# Patient Record
Sex: Male | Born: 1995 | Race: White | Hispanic: No | Marital: Single | Smoking: Never smoker
Health system: Southern US, Community
[De-identification: ages and names within clinical notes are randomized; demographics above are authoritative.]

## PROBLEM LIST (undated history)

## (undated) DIAGNOSIS — T7840XA Allergy, unspecified, initial encounter: Secondary | ICD-10-CM

## (undated) DIAGNOSIS — J45909 Unspecified asthma, uncomplicated: Secondary | ICD-10-CM

## (undated) DIAGNOSIS — Z8659 Personal history of other mental and behavioral disorders: Secondary | ICD-10-CM

## (undated) HISTORY — DX: Personal history of other mental and behavioral disorders: Z86.59

## (undated) HISTORY — DX: Allergy, unspecified, initial encounter: T78.40XA

---
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
## 2017-09-19 ENCOUNTER — Encounter: Payer: Self-pay | Admitting: Emergency Medicine

## 2017-09-19 ENCOUNTER — Emergency Department
Admission: EM | Admit: 2017-09-19 | Discharge: 2017-09-19 | Disposition: A | Discharge status: Home / Self Care | Payer: No Typology Code available for payment source | Attending: Emergency Medicine | Admitting: Emergency Medicine

## 2017-09-19 ENCOUNTER — Emergency Department: Payer: No Typology Code available for payment source

## 2017-09-19 DIAGNOSIS — J45909 Unspecified asthma, uncomplicated: Secondary | ICD-10-CM | POA: Insufficient documentation

## 2017-09-19 DIAGNOSIS — N5089 Other specified disorders of the male genital organs: Secondary | ICD-10-CM

## 2017-09-19 DIAGNOSIS — N453 Epididymo-orchitis: Secondary | ICD-10-CM | POA: Insufficient documentation

## 2017-09-19 DIAGNOSIS — N50812 Left testicular pain: Secondary | ICD-10-CM | POA: Diagnosis present

## 2017-09-19 LAB — CHLAMYDIA/NGC RT PCR (ARMC ONLY)
Chlamydia Tr: NOT DETECTED
N GONORRHOEAE: NOT DETECTED

## 2017-09-19 MED ORDER — CEFTRIAXONE SODIUM 250 MG IJ SOLR
250.0000 mg | Freq: Once | INTRAMUSCULAR | Status: DC
  Filled 2017-09-19: qty 250

## 2017-09-19 MED ORDER — DOXYCYCLINE HYCLATE 100 MG PO TABS
100.0000 mg | ORAL_TABLET | Freq: Once | ORAL | Status: AC
  Administered 2017-09-19: 100 mg via ORAL
  Filled 2017-09-19: qty 1

## 2017-09-19 MED ORDER — IBUPROFEN 400 MG PO TABS
600.0000 mg | ORAL_TABLET | Freq: Once | ORAL | Status: AC
  Administered 2017-09-19: 600 mg via ORAL
  Filled 2017-09-19: qty 2

## 2017-09-19 NOTE — ED Triage Notes (Signed)
Pt [facility] due to testicle swelling and pain in the left testicle x1 day. Pt reports 1 week ago white discharge and painful urination.

## 2017-09-19 NOTE — ED Provider Notes (Signed)
[facility] Emergency Department Provider Note  ____________________________________________  Time seen: Approximately 8:13 PM  I have reviewed the triage vital signs and the nursing notes.   HISTORY  Chief Complaint Groin Swelling   HPI Bernard Price is a 22 y.o. male with no significant past medical history who presents for evaluation of left testicular pain.  Patient reports that the pain started today.  He is noted swelling of his testicles for the last few days.  The pain is sharp, located in his left testicle, radiating up to his abdomen, currently 6 out of 10.  No nausea, vomiting, fever or chills.  Patient reports that a week ago he noticed white discharge from his penis and has had a week of dysuria.  No prior history of STD.  Patient is sexually active with one male partner and does not use condoms.  Past Medical History:  Diagnosis Date  . Asthma    as a child   Prior to Admission medications   Not on File    Allergies Patient has no known allergies.  History reviewed. No pertinent family history.  Social History Social History   Tobacco Use  . Smoking status: Never Smoker  . Smokeless tobacco: Never Used  Substance Use Topics  . Alcohol use: No    Alcohol/week: 0.0 oz  . Drug use: Not on file    Review of Systems  Constitutional: Negative for fever. Eyes: Negative for visual changes. ENT: Negative for sore throat. Neck: No neck pain  Cardiovascular: Negative for chest pain. Respiratory: Negative for shortness of breath. Gastrointestinal: Negative for abdominal pain, vomiting or diarrhea. Genitourinary: + dysuria and testicular pain/ swelling Musculoskeletal: Negative for back pain. Skin: Negative for rash. Neurological: Negative for headaches, weakness or numbness. Psych: No SI or HI  ____________________________________________   PHYSICAL EXAM:  VITAL SIGNS: ED Triage Vitals  Enc Vitals Group     BP 09/19/17  1912 140/65     Pulse Rate 09/19/17 1912 91     Resp 09/19/17 1912 17     Temp 09/19/17 1912 98.8 F (37.1 C)     Temp Source 09/19/17 1912 Oral     SpO2 09/19/17 1912 98 %     Weight 09/19/17 1913 200 lb (90.7 kg)     Height --      Head Circumference --      Peak Flow --      Pain Score 09/19/17 1929 7     Pain Loc --      Pain Edu? --      Excl. in GC? --     Constitutional: Alert and oriented. Well appearing and in no apparent distress. HEENT:      Head: Normocephalic and atraumatic.         Eyes: Conjunctivae are normal. Sclera is non-icteric.       Mouth/Throat: Mucous membranes are moist.       Neck: Supple with no signs of meningismus. Cardiovascular: Regular rate and rhythm. No murmurs, gallops, or rubs. 2+ symmetrical distal pulses are present in all extremities. No JVD. Respiratory: Normal respiratory effort. Lungs are clear to auscultation bilaterally. No wheezes, crackles, or rhonchi.  Gastrointestinal: Soft, non tender, and non distended with positive bowel sounds. No rebound or guarding. Genitourinary: No CVA tenderness. Bilateral testicles are descended, left testicle is tender to palpation over the epididymis, no tenderness to palpation on the right testicle, bilateral positive cremasteric reflexes are present, no swelling or erythema of  the scrotum. No evidence of inguinal hernia. Musculoskeletal: Nontender with normal range of motion in all extremities. No edema, cyanosis, or erythema of extremities. Neurologic: Normal speech and language. Face is symmetric. Moving all extremities. No gross focal neurologic deficits are appreciated. Skin: Skin is warm, dry and intact. No rash noted. Psychiatric: Mood and affect are normal. Speech and behavior are normal.  ____________________________________________   LABS (all labs ordered are listed, but only abnormal results are displayed)  Labs Reviewed  CHLAMYDIA/NGC RT PCR [facility] ONLY)    ____________________________________________  EKG  none  ____________________________________________  RADIOLOGY  I have personally reviewed the images performed during this visit and I agree with the Radiologist's read.   Interpretation by Radiologist:  [place] Scrotum W/doppler  Result Date: 09/19/2017 CLINICAL DATA:  22 year old male with left testicle pain and bilateral testicular swelling for 1 day. Burning with urination for 2 days. EXAM: SCROTAL ULTRASOUND DOPPLER ULTRASOUND OF THE TESTICLES TECHNIQUE: Complete ultrasound examination of the testicles, epididymis, and other scrotal structures was performed. Color and spectral Doppler ultrasound were also utilized to evaluate blood flow to the testicles. COMPARISON:  None. FINDINGS: Right testicle Measurements: 5.0 x 2.8 x 3.3 centimeters. No mass or microlithiasis visualized. Left testicle Measurements: 4.6 x 2.4 x 3.1 centimeters. No mass or microlithiasis visualized. Right epididymis: Normal in size and appearance. No hypervascularity (image 49). Left epididymis: Enlarged and heterogeneous left epididymis tail (images 58 through 61) with hypervascularity. Hydrocele:  None visualized. Varicocele:  None visualized. Pulsed Doppler interrogation of both testes demonstrates low resistance arterial and venous waveforms bilaterally, with questionable bilateral testicular hypervascularity. IMPRESSION: 1. Enlarged and hypervascular left epididymis compatible with Acute Epididymitis. 2. Questionable associated mild acute bilateral orchitis, as evidenced by possible bilateral testicular hypervascularity. 3. Negative for testicular torsion, testicular mass, or hydrocele. Electronically Signed   By: Odessa FlemingH  Hall M.D.   On: 09/19/2017 20:39      ____________________________________________   PROCEDURES  Procedure(s) performed: None Procedures Critical Care performed:  None ____________________________________________   INITIAL IMPRESSION /  ASSESSMENT AND PLAN / ED COURSE  22 y.o. male with no significant past medical history who presents for evaluation of left testicular pain x 1 day in the setting of white discharge from his penis and dysuria for a week.  On exam there is no evidence of torsion with positive cremasteric reflex and normal lie of bilateral testicles.  Patient is tender to palpation over the left epididymis.  Differential diagnosis includes epididymitis versus orchitis versus varicocele versus hydrocele versus testicular torsion versus testicular mass.  Will check gonorrhea chlamydia in the urine and send patient for an ultrasound.    _________________________ 9:57 PM on 09/19/2017 -----------------------------------------  Ultrasound consistent with epididymoorchitis.  Patient had already received Rocephin at urgent care and given a prescription for doxycycline.  We will give the first dose of doxycycline here since pharmacy is already closed.  Discussed signs and symptoms of torsion and recommended emergent evaluation if these develop.  Discussed safe sex counseling with patient and recommended follow-up with his primary care doctor to be tested for other sexually transmitted diseases such as syphilis or HIV.  Discussed the treatment of sexual partners to prevent reinfection.   As part of my medical decision making, I reviewed the following data within the electronic MEDICAL RECORD NUMBER Nursing notes reviewed and incorporated, Labs reviewed , Radiograph reviewed , Notes from prior ED visits and Echo Controlled Substance Database    Pertinent labs & imaging results that were available during my care  of the patient were reviewed by me and considered in my medical decision making (see chart for details).    ____________________________________________   FINAL CLINICAL IMPRESSION(S) / ED DIAGNOSES  Final diagnoses:  Epididymoorchitis      NEW MEDICATIONS STARTED DURING THIS VISIT:  ED Discharge Orders    None        Note:  This document was prepared using Dragon voice recognition software and may include unintentional dictation errors.    Nita Sickle, MD 09/19/17 2159

## 2017-09-19 NOTE — Discharge Instructions (Addendum)
Take the antibiotics given to you at urgent care.  Follow-up with your doctor in 3 days.  As I explained to you it is very important to use condoms to protect you from catching sexually transmitted diseases.  There are other diseases such as HIV and syphilis which can be caught by unprotected sex.  We have not tested you for those here.  Please discuss it with your primary care doctor so you can be tested as well.  Return to the emergency room if you have new or severe testicular pain, swelling or redness.

## 2018-02-24 ENCOUNTER — Encounter: Payer: Self-pay | Admitting: Family Medicine

## 2018-02-24 ENCOUNTER — Other Ambulatory Visit: Payer: Self-pay

## 2018-02-24 ENCOUNTER — Ambulatory Visit (INDEPENDENT_AMBULATORY_CARE_PROVIDER_SITE_OTHER): Payer: PRIVATE HEALTH INSURANCE | Admitting: Family Medicine

## 2018-02-24 VITALS — BP 107/69 | HR 89 | Temp 98.2°F | Ht 70.2 in | Wt 206.0 lb

## 2018-02-24 DIAGNOSIS — Z0000 Encounter for general adult medical examination without abnormal findings: Secondary | ICD-10-CM

## 2018-02-24 DIAGNOSIS — Z23 Encounter for immunization: Secondary | ICD-10-CM

## 2018-02-24 LAB — UA/M W/RFLX CULTURE, ROUTINE
BILIRUBIN UA: NEGATIVE
GLUCOSE, UA: NEGATIVE
KETONES UA: NEGATIVE
LEUKOCYTES UA: NEGATIVE
Nitrite, UA: NEGATIVE
Protein, UA: NEGATIVE
RBC UA: NEGATIVE
SPEC GRAV UA: 1.01 (ref 1.005–1.030)
Urobilinogen, Ur: 0.2 mg/dL (ref 0.2–1.0)
pH, UA: 7 (ref 5.0–7.5)

## 2018-02-24 LAB — LIPID PANEL PICCOLO, WAIVED
Chol/HDL Ratio Piccolo,Waive: 2.9 mg/dL
Cholesterol Piccolo, Waived: 135 mg/dL (ref <200)
HDL Chol Piccolo, Waived: 47 mg/dL — ABNORMAL LOW (ref >59)
LDL Chol Calc Piccolo Waived: 63 mg/dL (ref <100)
Triglycerides Piccolo,Waived: 127 mg/dL (ref <150)
VLDL CHOL CALC PICCOLO,WAIVE: 25 mg/dL (ref <30)

## 2018-02-24 NOTE — Patient Instructions (Signed)
Preventive Care for [place], Male The transition to life after high school as a young adult can be a stressful time with many changes. You may start seeing a primary care physician instead of a pediatrician. This is the time when your health care becomes your responsibility. Preventive care refers to lifestyle choices and visits with your health care provider that can promote health and wellness. What does preventive care include?  A yearly physical exam. This is also called an annual wellness visit.  Dental exams once or twice a year.  Routine eye exams. Ask your health care provider how often you should have your eyes checked.  Personal lifestyle choices, including: ? Daily care of your teeth and gums. ? Regular physical activity. ? Eating a healthy diet. ? Avoiding tobacco and drug use. ? Avoiding or limiting alcohol use. ? Practicing safe sex. What happens during an annual wellness visit? Preventive care starts with a yearly visit to your primary care physician. The services and screenings done by your health care provider during your annual wellness visit will depend on your overall health, lifestyle risk factors, and family history of disease. Counseling Your health care provider may ask you questions about:  Past medical problems and your family's medical history.  Medicines or supplements that you take.  Health insurance and access to health care.  Alcohol, tobacco, and drug use, including use of any bodybuilding drugs (anabolic steroids).  Your safety at home, work, or school.  Access to firearms.  Emotional well-being and how you cope with stress.  Relationship well-being.  Diet, exercise, and sleep habits.  Your sexual health and activity.  Screening You may have the following tests or measurements:  Height, weight, and BMI.  Blood pressure.  Lipid and cholesterol levels.  Tuberculosis skin test.  Skin exam.  Vision and hearing tests.  Genital  exam to check for testicular cancer or hernias.  Screening test for hepatitis.  Screening tests for STDs (sexually transmitted diseases), if you are at risk.  Vaccines Your health care provider may recommend certain vaccines, such as:  Influenza vaccine. This is recommended every year.  Tetanus, diphtheria, and acellular pertussis (Tdap, Td) vaccine. You may need a Td booster every 10 years.  Varicella vaccine. You may need this if you have not been vaccinated.  HPV vaccine. If you are 8 or younger, you may need three doses over 6 months.  Measles, mumps, and rubella (MMR) vaccine. You may need at least one dose of MMR. You may also need a second dose.  Pneumococcal 13-valent conjugate (PCV13) vaccine. You may need this if you have certain conditions and have not been vaccinated.  Pneumococcal polysaccharide (PPSV23) vaccine. You may need one or two doses if you smoke cigarettes or if you have certain conditions.  Meningococcal vaccine. One dose is recommended if you are age 83-21 years and a first-year college student living in a residence hall, or if you have one of several medical conditions. You may also need additional booster doses.  Hepatitis A vaccine. You may need this if you have certain conditions or if you travel or work in places where you may be exposed to hepatitis A.  Hepatitis B vaccine. You may need this if you have certain conditions or if you travel or work in places where you may be exposed to hepatitis B.  Haemophilus influenzae type b (Hib) vaccine. You may need this if you have certain risk factors.  Talk to your health care provider about which  screenings and vaccines you need and how often you need them. What steps can I take to develop healthy behaviors?  Have regular preventive health care visits with your primary care physician and dentist.  Eat a healthy diet.  Drink enough fluid to keep your urine clear or pale yellow.  Stay active. Exercise at  least 30 minutes 5 or more days of the week.  Use alcohol responsibly.  Maintain a healthy weight.  Do not use any products that contain nicotine, such as cigarettes, chewing tobacco, and e-cigarettes. If you need help quitting, ask your health care provider.  Do not use drugs.  Practice safe sex. This includes using condoms to prevent STDs or an unwanted pregnancy.  Find healthy ways to manage stress. How can I protect myself from injury? Injuries from violence or accidents are the leading cause of death among young adults and can often be prevented. Take these steps to help protect yourself:  Always wear your seat belt while driving or riding in a vehicle.  Do not drive if you have been drinking alcohol. Do not ride with someone who has been drinking.  Do not drive when you are tired or distracted. Do not text while driving.  Wear a helmet and other protective equipment during sports activities.  If you have firearms in your house, make sure you follow all gun safety procedures.  Seek help if you have been bullied, physically abused, or sexually abused.  Avoid fighting.  Use the Internet responsibly to avoid dangers such as online bullying.  What can I do to cope with stress? Young adults may face many new challenges that can be stressful, such as finding a job, going to college, moving away from home, managing money, being in a relationship, getting married, and having children. To manage stress:  Avoid known stressful situations when you can.  Exercise regularly.  Find a stress-reducing activity that works best for you. Examples include meditation, yoga, listening to music, or reading.  Spend time in nature.  Keep a journal to write about your stress and how you respond.  Talk to your health care provider about stress. He or she may suggest counseling.  Spend time with supportive friends or family.  Do not cope with stress by: ? Drinking alcohol or using  drugs. ? Smoking cigarettes. ? Eating.  Where can I get more information? Learn more about preventive care and healthy habits from:  U.S. Preventive Services Task Force: StageSync.si  National Adolescent and [place]: StrategicRoad.nl  American Academy of Pediatrics Bright Futures: https://brightfutures.MemberVerification.co.za  Society for Adolescent Health and Medicine: MoralBlog.co.za.aspx  PodExchange.nl: ToyLending.fr  This information is not intended to replace advice given to you by your health care provider. Make sure you discuss any questions you have with your health care provider. Document Released: 07/24/2015 Document Revised: 08/14/2015 Document Reviewed: 07/24/2015 Elsevier Interactive Patient Education  2018 [place]. Hepatitis A Vaccine: What You Need to Know 1. Why get vaccinated? Hepatitis A is a serious liver disease. It is caused by the hepatitis A virus (HAV). HAV is spread from person to person through contact with the feces (stool) of people who are infected, which can easily happen if someone does not wash his or her hands properly. You can also get hepatitis A from food, water, or objects contaminated with HAV. Symptoms of hepatitis A can include:  fever, fatigue, loss of appetite, nausea, vomiting, and/or joint pain  severe stomach pains and diarrhea (mainly in children), or  jaundice (yellow skin or eyes, dark urine, clay-colored bowel movements).  These symptoms usually appear 2 to 6 weeks after exposure and usually last less than 2 months, although some people can be ill for as long as 6 months. If you have hepatitis A you may be too ill to work. Children often do not have symptoms, but most adults do. You can spread HAV  without having symptoms. Hepatitis A can cause liver failure and death, although this is rare and occurs more commonly in persons 53 years of age or older and persons with other liver diseases, such as hepatitis B or C. Hepatitis A vaccine can prevent hepatitis A. Hepatitis A vaccines were recommended in the [place] beginning in 1996. Since then, the number of cases reported each year in the [place] has dropped from around 31,000 cases to fewer than 1,500 cases. 2. Hepatitis A vaccine Hepatitis A vaccine is an inactivated (killed) vaccine. You will need 2 doses for long-lasting protection. These doses should be given at least 6 months apart. Children are routinely vaccinated between their first and second birthdays (53 through 23 months of age). Older children and adolescents can get the vaccine after 23 months. Adults who have not been vaccinated previously and want to be protected against hepatitis A can also get the vaccine. You should get hepatitis A vaccine if you:  are traveling to countries where hepatitis A is common,  are a man who has sex with other men,  use illegal drugs,  have a chronic liver disease such as hepatitis B or hepatitis C,  are being treated with clotting-factor concentrates,  work with hepatitis A-infected animals or in a hepatitis A research laboratory, or  expect to have close personal contact with an international adoptee from a country where hepatitis A is common  Ask your healthcare provider if you want more information about any of these groups. There are no known risks to getting hepatitis A vaccine at the same time as other vaccines. 3. Some people should not get this vaccine Tell the person who is giving you the vaccine:  If you have any severe, life-threatening allergies. If you ever had a life-threatening allergic reaction after a dose of hepatitis A vaccine, or have a severe allergy to any part of this vaccine, you may be advised not to get  vaccinated. Ask your health care provider if you want information about vaccine components.  If you are not feeling well. If you have a mild illness, such as a cold, you can probably get the vaccine today. If you are moderately or severely ill, you should probably wait until you recover. Your doctor can advise you.  4. Risks of a vaccine reaction With any medicine, including vaccines, there is a chance of side effects. These are usually mild and go away on their own, but serious reactions are also possible. Most people who get hepatitis A vaccine do not have any problems with it. Minor problems following hepatitis A vaccine include:  soreness or redness where the shot was given  low-grade fever  headache  tiredness  If these problems occur, they usually begin soon after the shot and last 1 or 2 days. Your doctor can tell you more about these reactions. Other problems that could happen after this vaccine:  People sometimes faint after a medical procedure, including vaccination. Sitting or lying down for about 15 minutes can help prevent fainting, and injuries caused by a fall. Tell your provider if you feel dizzy, or  have vision changes or ringing in the ears.  Some people get shoulder pain that can be more severe and longer lasting than the more routine soreness that can follow injections. This happens very rarely.  Any medication can cause a severe allergic reaction. Such reactions from a vaccine are very rare, estimated at about 1 in a million doses, and would happen within a few minutes to a few hours after the vaccination. As with any medicine, there is a very remote chance of a vaccine causing a serious injury or death. The safety of vaccines is always being monitored. For more information, visit: http://www.aguilar.org/ 5. What if there is a serious problem? What should I look for? Look for anything that concerns you, such as signs of a severe allergic reaction, very high  fever, or unusual behavior. Signs of a severe allergic reaction can include hives, swelling of the face and throat, difficulty breathing, a fast heartbeat, dizziness, and weakness. These would start a few minutes to a few hours after the vaccination. What should I do?  If you think it is a severe allergic reaction or other emergency that can't wait, call 9-1-1 or get to the nearest hospital. Otherwise, call your clinic.  Afterward, the reaction should be reported to the Vaccine Adverse Event Reporting System (VAERS). Your doctor should file this report, or you can do it yourself through the VAERS web site at www.vaers.SamedayNews.es, or by calling 360-201-2422. ? VAERS does not give medical advice. 6. The National Vaccine Injury Compensation Program The [employer] Vaccine Injury Compensation Program (VICP) is a federal program that was created to compensate people who may have been injured by certain vaccines. Persons who believe they may have been injured by a vaccine can learn about the program and about filing a claim by calling (579)187-3018 or visiting the [place] website at GoldCloset.com.ee. There is a time limit to file a claim for compensation. 7. How can I learn more?  Ask your healthcare provider. He or she can give you the vaccine package insert or suggest other sources of information.  Call your local or state health department.  Contact the Centers for Disease Control and Prevention (CDC): ? Call 405 216 2202 (1-800-CDC-INFO) or ? Visit CDC's website at http://hunter.com/ CDC Hepatitis A Vaccine VIS (10/09/2014) This information is not intended to replace advice given to you by your health care provider. Make sure you discuss any questions you have with your health care provider. Document Released: 12/31/2005 Document Revised: 11/27/2015 Document Reviewed: 11/27/2015 Elsevier Interactive Patient Education  2017 [employer] HPV (Human Papillomavirus) Vaccine: What You  Need to Know 1. Why get vaccinated? HPV vaccine prevents infection with human papillomavirus (HPV) types that are associated with many cancers, including:  cervical cancer in females,  vaginal and vulvar cancers in females,  anal cancer in females and males,  throat cancer in females and males, and  penile cancer in males.  In addition, HPV vaccine prevents infection with HPV types that cause genital warts in both females and males. In the [place] about 12,000 women get cervical cancer every year, and about 4,000 women die from it. HPV vaccine can prevent most of these cases of cervical cancer. Vaccination is not a substitute for cervical cancer screening. This vaccine does not protect against all HPV types that can cause cervical cancer. Women should still get regular Pap tests. HPV infection usually comes from sexual contact, and most people will become infected at some point in their life. About 14 million Americans, including teens,  get infected every year. Most infections will go away on their own and not cause serious problems. But thousands of women and men get cancer and other diseases from HPV. 2. HPV vaccine HPV vaccine is approved by FDA and is recommended by CDC for both males and females. It is routinely given at 2 or 20 years of age, but it may be given beginning at age 35 years through age 45 years. Most adolescents 9 through 20 years of age should get HPV vaccine as a two-dose series with the doses separated by 6-12 months. People who start HPV vaccination at 97 years of age and older should get the vaccine as a three-dose series with the second dose given 1-2 months after the first dose and the third dose given 6 months after the first dose. There are several exceptions to these age recommendations. Your health care provider can give you more information. 3. Some people should not get this vaccine  Anyone who has had a severe (life-threatening) allergic reaction to a dose of HPV  vaccine should not get another dose.  Anyone who has a severe (life threatening) allergy to any component of HPV vaccine should not get the vaccine.  Tell your doctor if you have any severe allergies that you know of, including a severe allergy to yeast.  HPV vaccine is not recommended for pregnant women. If you learn that you were pregnant when you were vaccinated, there is no reason to expect any problems for you or your baby. Any woman who learns she was pregnant when she got HPV vaccine is encouraged to contact the manufacturer's registry for HPV vaccination during pregnancy at (657) 155-3207. Women who are breastfeeding may be vaccinated.  If you have a mild illness, such as a cold, you can probably get the vaccine today. If you are moderately or severely ill, you should probably wait until you recover. Your doctor can advise you. 4. Risks of a vaccine reaction With any medicine, including vaccines, there is a chance of side effects. These are usually mild and go away on their own, but serious reactions are also possible. Most people who get HPV vaccine do not have any serious problems with it. Mild or moderate problems following HPV vaccine:  Reactions in the arm where the shot was given: ? Soreness (about 9 people in 10) ? Redness or swelling (about 1 person in 3)  Fever: ? Mild (100F) (about 1 person in 10) ? Moderate (102F) (about 1 person in 49)  Other problems: ? Headache (about 1 person in 3) Problems that could happen after any injected vaccine:  People sometimes faint after a medical procedure, including vaccination. Sitting or lying down for about 15 minutes can help prevent fainting, and injuries caused by a fall. Tell your doctor if you feel dizzy, or have vision changes or ringing in the ears.  Some people get severe pain in the shoulder and have difficulty moving the arm where a shot was given. This happens very rarely.  Any medication can cause a severe allergic  reaction. Such reactions from a vaccine are very rare, estimated at about 1 in a million doses, and would happen within a few minutes to a few hours after the vaccination. As with any medicine, there is a very remote chance of a vaccine causing a serious injury or death. The safety of vaccines is always being monitored. For more information, visit: http://www.aguilar.org/. 5. What if there is a serious reaction? What should I look for? Look  for anything that concerns you, such as signs of a severe allergic reaction, very high fever, or unusual behavior. Signs of a severe allergic reaction can include hives, swelling of the face and throat, difficulty breathing, a fast heartbeat, dizziness, and weakness. These would usually start a few minutes to a few hours after the vaccination. What should I do? If you think it is a severe allergic reaction or other emergency that can't wait, call 9-1-1 or get to the nearest hospital. Otherwise, call your doctor. Afterward, the reaction should be reported to the Vaccine Adverse Event Reporting System (VAERS). Your doctor should file this report, or you can do it yourself through the VAERS web site at www.vaers.SamedayNews.es, or by calling 6035029064. VAERS does not give medical advice. 6. The National Vaccine Injury Compensation Program The [employer] Vaccine Injury Compensation Program (VICP) is a federal program that was created to compensate people who may have been injured by certain vaccines. Persons who believe they may have been injured by a vaccine can learn about the program and about filing a claim by calling (708)776-7814 or visiting the [place] website at GoldCloset.com.ee. There is a time limit to file a claim for compensation. 7. How can I learn more?  Ask your health care provider. He or she can give you the vaccine package insert or suggest other sources of information.  Call your local or state health department.  Contact the  Centers for Disease Control and Prevention (CDC): ? Call 623-475-5177 (1-800-CDC-INFO) or ? Visit CDC's website at http://sweeney-todd.com/ Vaccine Information Statement, HPV Vaccine (02/21/2015) This information is not intended to replace advice given to you by your health care provider. Make sure you discuss any questions you have with your health care provider. Document Released: 10/03/2013 Document Revised: 11/27/2015 Document Reviewed: 11/27/2015 Elsevier Interactive Patient Education  2017 [employer].

## 2018-02-24 NOTE — Progress Notes (Signed)
BP 107/69   Pulse 89   Temp 98.2 F (36.8 C) (Oral)   Ht 5' 10.2" (1.783 m)   Wt 206 lb (93.4 kg)   SpO2 97%   BMI 29.39 kg/m    Subjective:    Patient ID: Bernard Price, male    DOB: 11/04/1995, 22 y.o.   MRN: [id number]  HPI: Bernard Price is a 22 y.o. male presenting on 02/24/2018 for comprehensive medical examination and to establish care. Current medical complaints include:none  He currently lives with: parents Interim Problems from his last visit: no  Depression Screen done today and results listed below:  Depression screen PHQ 2/9 02/24/2018  Decreased Interest 1  Down, Depressed, Hopeless 0  PHQ - 2 Score 1  Altered sleeping 0  Tired, decreased energy 1  Change in appetite 0  Feeling bad or failure about yourself  0  Trouble concentrating 1  Moving slowly or fidgety/restless 1  Suicidal thoughts 0  PHQ-9 Score 4  Difficult doing work/chores Not difficult at all     Past Medical History:  Past Medical History:  Diagnosis Date  . Asthma    as a child    Surgical History:  History reviewed. No pertinent surgical history.  Medications:  No current outpatient medications on file prior to visit.   No current facility-administered medications on file prior to visit.     Allergies:  No Known Allergies  Social History:  Social History   Socioeconomic History  . Marital status: Single    Spouse name: Not on file  . Number of children: Not on file  . Years of education: Not on file  . Highest education level: Not on file  Occupational History  . Not on file  Social Needs  . Financial resource strain: Not on file  . Food insecurity:    Worry: Not on file    Inability: Not on file  . Transportation needs:    Medical: Not on file    Non-medical: Not on file  Tobacco Use  . Smoking status: Never Smoker  . Smokeless tobacco: Never Used  Substance and Sexual Activity  . Alcohol use: No    Alcohol/week: 0.0 standard drinks  . Drug use: Never    . Sexual activity: Yes  Lifestyle  . Physical activity:    Days per week: Not on file    Minutes per session: Not on file  . Stress: Not on file  Relationships  . Social connections:    Talks on phone: Not on file    Gets together: Not on file    Attends religious service: Not on file    Active member of club or organization: Not on file    Attends meetings of clubs or organizations: Not on file    Relationship status: Not on file  . Intimate partner violence:    Fear of current or ex partner: Not on file    Emotionally abused: Not on file    Physically abused: Not on file    Forced sexual activity: Not on file  Other Topics Concern  . Not on file  Social History Narrative  . Not on file   Social History   Tobacco Use  Smoking Status Never Smoker  Smokeless Tobacco Never Used   Social History   Substance and Sexual Activity  Alcohol Use No  . Alcohol/week: 0.0 standard drinks    Family History:  Family History  Problem Relation Age of Onset  .  Hypertension Mother   . Diabetes Mother   . Diabetes Maternal Grandmother   . Hypertension Maternal Grandmother   . Hypertension Maternal Grandfather   . Hypertension Paternal Grandmother   . Diabetes Paternal Grandmother     Past medical history, surgical history, medications, allergies, family history and social history reviewed with patient today and changes made to appropriate areas of the chart.   Review of Systems  Constitutional: Negative.   HENT: Positive for congestion. Negative for ear discharge, ear pain, hearing loss, nosebleeds, sinus pain, sore throat and tinnitus.        Post-nasal drip  Eyes: Negative.   Respiratory: Negative.  Negative for stridor.   Cardiovascular: Positive for palpitations (with drinking coffee). Negative for chest pain, orthopnea, claudication, leg swelling and PND.  Gastrointestinal: Negative.   Genitourinary: Negative.   Musculoskeletal: Negative.   Skin: Negative.    Neurological: Negative.   Endo/Heme/Allergies: Positive for environmental allergies. Negative for polydipsia. Does not bruise/bleed easily.  Psychiatric/Behavioral: Negative.     All other ROS negative except what is listed above and in the HPI.      Objective:    BP 107/69   Pulse 89   Temp 98.2 F (36.8 C) (Oral)   Ht 5' 10.2" (1.783 m)   Wt 206 lb (93.4 kg)   SpO2 97%   BMI 29.39 kg/m   Wt Readings from Last 3 Encounters:  02/24/18 206 lb (93.4 kg)  09/19/17 200 lb (90.7 kg)  08/13/14 175 lb (79.4 kg) (87 %, Z= 1.12)*   * Growth percentiles are based on CDC (Boys, 2-20 Years) data.    Physical Exam  Constitutional: He is oriented to person, place, and time. He appears well-developed and well-nourished. No distress.  HENT:  Head: Normocephalic and atraumatic.  Right Ear: Hearing, tympanic membrane, external ear and ear canal normal.  Left Ear: Hearing, tympanic membrane, external ear and ear canal normal.  Nose: Nose normal.  Mouth/Throat: Uvula is midline, oropharynx is clear and moist and mucous membranes are normal. No oropharyngeal exudate.  Eyes: Pupils are equal, round, and reactive to light. Conjunctivae, EOM and lids are normal. Right eye exhibits no discharge. Left eye exhibits no discharge. No scleral icterus.  Neck: Normal range of motion. Neck supple. No JVD present. No tracheal deviation present. No thyromegaly present.  Cardiovascular: Normal rate, regular rhythm, normal heart sounds and intact distal pulses. Exam reveals no gallop and no friction rub.  No murmur heard. Pulmonary/Chest: Effort normal and breath sounds normal. No stridor. No respiratory distress. He has no wheezes. He has no rales. He exhibits no tenderness.  Abdominal: Soft. Bowel sounds are normal. He exhibits no distension and no mass. There is no tenderness. There is no rebound and no guarding. No hernia.  Genitourinary: Penis normal. No penile tenderness.  Musculoskeletal: Normal range  of motion. He exhibits no edema, tenderness or deformity.  Lymphadenopathy:    He has no cervical adenopathy.  Neurological: He is alert and oriented to person, place, and time. He displays normal reflexes. No cranial nerve deficit or sensory deficit. He exhibits normal muscle tone. Coordination normal.  Skin: Skin is warm, dry and intact. Capillary refill takes less than 2 seconds. No rash noted. He is not diaphoretic. No erythema. No pallor.  Psychiatric: He has a normal mood and affect. His speech is normal and behavior is normal. Judgment and thought content normal. Cognition and memory are normal.  Nursing note and vitals reviewed.  Genital exam done today  with Tiffany Reel, CMA in attendance.  Results for orders placed or performed during the hospital encounter of 09/19/17  Chlamydia/NGC rt PCR ([facility] only)  Result Value Ref Range   Specimen source GC/Chlam ENDOCERVICAL    Chlamydia Tr NOT DETECTED NOT DETECTED   N gonorrhoeae NOT DETECTED NOT DETECTED      Assessment & Plan:   Problem List Items Addressed This Visit    None    Visit Diagnoses    Routine general medical examination at a health care facility    -  Primary   Vaccines updated. Screening labs checked today. Continue diet and exercise. Call with any concrens.    Relevant Orders   CBC with Differential/Platelet   Comprehensive metabolic panel   Lipid Panel Piccolo, Waived   TSH   UA/M w/rflx Culture, Routine   HIV Antibody (routine testing w rflx)   Immunization due       Hep A#2 and HPV #3 given today.   Relevant Orders   HPV 9-valent vaccine,Recombinat (Completed)   Hepatitis A vaccine adult IM       LABORATORY TESTING:  Health maintenance labs ordered today as discussed above.   IMMUNIZATIONS:   - Tdap: Tetanus vaccination status reviewed: last tetanus booster within 10 years. - Influenza: Up to date - Pneumovax: Not applicable - Prevnar: Up to date - HPV: Administered today\ - Hep A: Due for #2-  given today  PATIENT COUNSELING:    Sexuality: Discussed sexually transmitted diseases, partner selection, use of condoms, avoidance of unintended pregnancy  and contraceptive alternatives.   Advised to avoid cigarette smoking.  I discussed with the patient that most people either abstain from alcohol or drink within safe limits (<=14/week and <=4 drinks/occasion for males, <=7/weeks and <= 3 drinks/occasion for females) and that the risk for alcohol disorders and other health effects rises proportionally with the number of drinks per week and how often a drinker exceeds daily limits.  Discussed cessation/primary prevention of drug use and availability of treatment for abuse.   Diet: Encouraged to adjust caloric intake to maintain  or achieve ideal body weight, to reduce intake of dietary saturated fat and total fat, to limit sodium intake by avoiding high sodium foods and not adding table salt, and to maintain adequate dietary potassium and calcium preferably from fresh fruits, vegetables, and low-fat dairy products.    stressed the importance of regular exercise  Injury prevention: Discussed safety belts, safety helmets, smoke detector, smoking near bedding or upholstery.   Dental health: Discussed importance of regular tooth brushing, flossing, and dental visits.   Follow up plan: NEXT PREVENTATIVE PHYSICAL DUE IN 1 YEAR. Return in about 1 year (around 02/25/2019) for Physical.

## 2018-02-25 LAB — COMPREHENSIVE METABOLIC PANEL
A/G RATIO: 2 (ref 1.2–2.2)
ALT: 17 IU/L (ref 0–44)
AST: 17 IU/L (ref 0–40)
Albumin: 4.8 g/dL (ref 3.5–5.5)
Alkaline Phosphatase: 85 IU/L (ref 39–117)
BUN / CREAT RATIO: 9 (ref 9–20)
BUN: 9 mg/dL (ref 6–20)
Bilirubin Total: 0.5 mg/dL (ref 0.0–1.2)
CALCIUM: 10.1 mg/dL (ref 8.7–10.2)
CHLORIDE: 100 mmol/L (ref 96–106)
CO2: 26 mmol/L (ref 20–29)
CREATININE: 1.05 mg/dL (ref 0.76–1.27)
GFR, EST AFRICAN AMERICAN: 118 mL/min/{1.73_m2} (ref >59)
GFR, EST NON AFRICAN AMERICAN: 102 mL/min/{1.73_m2} (ref >59)
GLOBULIN, TOTAL: 2.4 g/dL (ref 1.5–4.5)
Glucose: 72 mg/dL (ref 65–99)
Potassium: 4.1 mmol/L (ref 3.5–5.2)
Sodium: 139 mmol/L (ref 134–144)
TOTAL PROTEIN: 7.2 g/dL (ref 6.0–8.5)

## 2018-02-25 LAB — CBC WITH DIFFERENTIAL/PLATELET
Basophils Absolute: 0 10*3/uL (ref 0.0–0.2)
Basos: 0 %
EOS (ABSOLUTE): 0.1 10*3/uL (ref 0.0–0.4)
Eos: 1 %
HEMATOCRIT: 47.2 % (ref 37.5–51.0)
Hemoglobin: 16.2 g/dL (ref 13.0–17.7)
IMMATURE GRANS (ABS): 0.1 10*3/uL (ref 0.0–0.1)
IMMATURE GRANULOCYTES: 1 %
Lymphocytes Absolute: 1.4 10*3/uL (ref 0.7–3.1)
Lymphs: 17 %
MCH: 30.2 pg (ref 26.6–33.0)
MCHC: 34.3 g/dL (ref 31.5–35.7)
MCV: 88 fL (ref 79–97)
MONOCYTES: 10 %
MONOS ABS: 0.9 10*3/uL (ref 0.1–0.9)
NEUTROS ABS: 6.1 10*3/uL (ref 1.4–7.0)
Neutrophils: 71 %
Platelets: 360 10*3/uL (ref 150–450)
RBC: 5.36 x10E6/uL (ref 4.14–5.80)
RDW: 12.6 % (ref 12.3–15.4)
WBC: 8.5 10*3/uL (ref 3.4–10.8)

## 2018-02-25 LAB — TSH: TSH: 0.636 u[IU]/mL (ref 0.450–4.500)

## 2018-02-25 LAB — HIV ANTIBODY (ROUTINE TESTING W REFLEX): HIV SCREEN 4TH GENERATION: NONREACTIVE

## 2018-02-27 ENCOUNTER — Encounter: Payer: Self-pay | Admitting: Family Medicine

## 2018-03-03 ENCOUNTER — Telehealth: Payer: Self-pay | Admitting: Family Medicine

## 2018-03-03 NOTE — Telephone Encounter (Signed)
Copied from CRM [phone]. Topic: Quick Communication - See Telephone Encounter >> Mar 03, 2018  9:07 AM Herby AbrahamJohnson, Shiquita C wrote: CRM for notification. See Telephone encounter for: 03/03/18.  Pt says that his anal area is itching really bad. Pt says that this is new for him, pt would like to know if provider could suggest something OTC that he could take or use to help? Pt says that he isn't having any other symptoms.    CB: [phone]

## 2018-03-03 NOTE — Telephone Encounter (Signed)
Message relayed to patient. Verbalized understanding and denied questions.   

## 2018-03-03 NOTE — Telephone Encounter (Signed)
He can take tucks pads or preparation H. If it's not getting better, let me know

## 2018-10-30 ENCOUNTER — Ambulatory Visit: Payer: PRIVATE HEALTH INSURANCE | Admitting: Nurse Practitioner

## 2018-10-30 ENCOUNTER — Ambulatory Visit: Payer: Self-pay | Admitting: *Deleted

## 2018-10-30 ENCOUNTER — Encounter: Payer: Self-pay | Admitting: Nurse Practitioner

## 2018-10-30 ENCOUNTER — Other Ambulatory Visit: Payer: Self-pay

## 2018-10-30 VITALS — BP 118/71 | HR 80 | Temp 98.6°F | Ht 70.2 in | Wt 234.0 lb

## 2018-10-30 DIAGNOSIS — R1084 Generalized abdominal pain: Secondary | ICD-10-CM | POA: Diagnosis not present

## 2018-10-30 DIAGNOSIS — R109 Unspecified abdominal pain: Secondary | ICD-10-CM | POA: Insufficient documentation

## 2018-10-30 LAB — UA/M W/RFLX CULTURE, ROUTINE
Bilirubin, UA: NEGATIVE
Glucose, UA: NEGATIVE
Ketones, UA: NEGATIVE
Leukocytes,UA: NEGATIVE
Nitrite, UA: NEGATIVE
Protein,UA: NEGATIVE
RBC, UA: NEGATIVE
Specific Gravity, UA: 1.01 (ref 1.005–1.030)
Urobilinogen, Ur: 0.2 mg/dL (ref 0.2–1.0)
pH, UA: 7 (ref 5.0–7.5)

## 2018-10-30 NOTE — Assessment & Plan Note (Signed)
Intermittent with fluctuating stool pattern.  UA negative. Suspect some element of IBS involved.  Obtain CBC and CMP.  Recommend daily probiotic and keeping food journal to document stool pattern and foods that may cause increased discomfort.  Return in one week for follow-up.  If ongoing issues consider imaging.

## 2018-10-30 NOTE — Telephone Encounter (Signed)
This morning, noticed abdominal pain, right-sided when twisting to get out of vehicle and severe abdomianl pain with voiding soon after that twist.Does not radiate to the side or back. No blood noticed in urine. Pain let up after voiding. Denies penile discharge/scrotum pain/swelling. No injury. Denies urgency.No fever/nausea/vomiting. Disposition is appointment within 24 hours. Understands if pain worsens to call back immediately.Routing to PCP for appointment.  Reason for Disposition . All other males with painful urination  Answer Assessment - Initial Assessment Questions 1. SEVERITY: "How bad is the pain?"  (e.g., Scale 1-10; mild, moderate, or severe)severe   - MILD (1-3): complains slightly about urination hurting   - MODERATE (4-7): interferes with normal activities     - SEVERE (8-10): excruciating, unwilling or unable to urinate because of the pain      2. FREQUENCY: "How many times have you had painful urination today?"      once 3. PATTERN: "Is pain present every time you urinate or just sometimes?"      Occurred once today 4. ONSET: "When did the painful urination start?"      This morning 5. FEVER: "Do you have a fever?" If so, ask: "What is your temperature, how was it measured, and when did it start?"     no 6. PAST UTI: "Have you had a urine infection before?" If so, ask: "When was the last time?" and "What happened that time?"      Thinks he has in the past.7. CAUSE: "What do you think is causing the painful urination?"      unsure 8. OTHER SYMPTOMS: "Do you have any other symptoms?" (e.g., flank pain, penile discharge, scrotal pain, blood in urine)     denies  Protocols used: URINATION PAIN - MALE-A-AH

## 2018-10-30 NOTE — Patient Instructions (Signed)
Abdominal Pain, Adult    Many things can cause belly (abdominal) pain. Most times, belly pain is not dangerous. Many cases of belly pain can be watched and treated at home. Sometimes belly pain is serious, though. Your doctor will try to find the cause of your belly pain.  Follow these instructions at home:  · Take over-the-counter and prescription medicines only as told by your doctor. Do not take medicines that help you poop (laxatives) unless told to by your doctor.  · Drink enough fluid to keep your pee (urine) clear or pale yellow.  · Watch your belly pain for any changes.  · Keep all follow-up visits as told by your doctor. This is important.  Contact a doctor if:  · Your belly pain changes or gets worse.  · You are not hungry, or you lose weight without trying.  · You are having trouble pooping (constipated) or have watery poop (diarrhea) for more than 2-3 days.  · You have pain when you pee or poop.  · Your belly pain wakes you up at night.  · Your pain gets worse with meals, after eating, or with certain foods.  · You are throwing up and cannot keep anything down.  · You have a fever.  Get help right away if:  · Your pain does not go away as soon as your doctor says it should.  · You cannot stop throwing up.  · Your pain is only in areas of your belly, such as the right side or the left lower part of the belly.  · You have bloody or black poop, or poop that looks like tar.  · You have very bad pain, cramping, or bloating in your belly.  · You have signs of not having enough fluid or water in your body (dehydration), such as:  ? Dark pee, very little pee, or no pee.  ? Cracked lips.  ? Dry mouth.  ? Sunken eyes.  ? Sleepiness.  ? Weakness.  This information is not intended to replace advice given to you by your health care provider. Make sure you discuss any questions you have with your health care provider.  Document Released: 08/25/2007 Document Revised: 09/26/2015 Document Reviewed: 08/20/2015  Elsevier  Interactive Patient Education © 2020 [employer]

## 2018-10-30 NOTE — Progress Notes (Signed)
BP 118/71   Pulse 80   Temp 98.6 F (37 C) (Oral)   Ht 5' 10.2" (1.783 m)   Wt 234 lb (106.1 kg)   SpO2 96%   BMI 33.38 kg/m    Subjective:    Patient ID: Bernard Price H Loughney, male    DOB: [birth year], 23 y.o.   MRN: [id number]  HPI: Bernard Price H Pardy is a 23 y.o. male  Chief Complaint  Patient presents with  . Abdominal Pain    right side and by the belly. pain started today.    ABDOMINAL PAIN  Was getting out of truck and there was a sharp pain to navel & right middle area abdomen (had twisted to get out of vehicle) and then he went to bathroom, urinal, afterwards and noticed some dizziness.  Still had some discomfort in abdomen at time,  Reports this has improved now and no further sharp pains.  Reports over past couple months he has had intermittent discomfort to navel area.  States stools he has a BM x 4 or more a day, reports more recently it has been looser stool.  Fluctuates between runny and firmer stool at baseline.  Denies any CP, SOB, or fever. Duration:days Onset: gradual Severity: 10/10 Quality: sharp Location:  RLQ  Episode duration: seconds Radiation: no Frequency: intermittent Alleviating factors: unknown Aggravating factors: unknown Status: stable Treatments attempted: none Fever: no Nausea: no Vomiting: no Weight loss: no Decreased appetite: no Diarrhea: no Constipation: no Blood in stool: no Heartburn: no Jaundice: no Rash: no Dysuria/urinary frequency: no Hematuria: no History of sexually transmitted disease: no Recurrent NSAID use: no  Relevant past medical, surgical, family and social history reviewed and updated as indicated. Interim medical history since our last visit reviewed. Allergies and medications reviewed and updated.  Review of Systems  Constitutional: Negative for activity change, diaphoresis, fatigue and fever.  Respiratory: Negative for cough, chest tightness, shortness of breath and wheezing.   Cardiovascular: Negative for  chest pain, palpitations and leg swelling.  Gastrointestinal: Positive for abdominal pain. Negative for abdominal distention, constipation, diarrhea, nausea and vomiting.  Endocrine: Negative for cold intolerance, heat intolerance, polydipsia, polyphagia and polyuria.  Musculoskeletal: Negative.   Skin: Negative.   Neurological: Negative for dizziness, syncope, weakness, light-headedness, numbness and headaches.  Psychiatric/Behavioral: Negative.     Per HPI unless specifically indicated above     Objective:    BP 118/71   Pulse 80   Temp 98.6 F (37 C) (Oral)   Ht 5' 10.2" (1.783 m)   Wt 234 lb (106.1 kg)   SpO2 96%   BMI 33.38 kg/m   Wt Readings from Last 3 Encounters:  10/30/18 234 lb (106.1 kg)  02/24/18 206 lb (93.4 kg)  09/19/17 200 lb (90.7 kg)    Physical Exam Vitals signs and nursing note reviewed.  Constitutional:      General: He is awake. He is not in acute distress.    Appearance: He is well-developed and overweight. He is not ill-appearing.  HENT:     Head: Normocephalic and atraumatic.     Right Ear: Hearing normal. No drainage.     Left Ear: Hearing normal. No drainage.     Mouth/Throat:     Pharynx: Uvula midline.  Eyes:     General: Lids are normal.        Right eye: No discharge.        Left eye: No discharge.     Conjunctiva/sclera: Conjunctivae normal.  Pupils: Pupils are equal, round, and reactive to light.  Neck:     Musculoskeletal: Normal range of motion and neck supple.     Thyroid: No thyromegaly.     Vascular: No carotid bruit.     Trachea: Trachea normal.  Cardiovascular:     Rate and Rhythm: Normal rate and regular rhythm.     Heart sounds: Normal heart sounds, S1 normal and S2 normal. No murmur. No gallop.   Pulmonary:     Effort: Pulmonary effort is normal. No accessory muscle usage or respiratory distress.     Breath sounds: Normal breath sounds.  Abdominal:     General: Bowel sounds are normal.     Palpations: Abdomen is  soft. There is no hepatomegaly or splenomegaly.     Tenderness: There is abdominal tenderness in the suprapubic area. There is no right CVA tenderness, left CVA tenderness, guarding or rebound. Negative signs include Murphy's sign, Rovsing's sign, McBurney's sign, psoas sign and obturator sign.     Hernia: No hernia is present.     Comments: On palpation area of main tenderness noted over suprapubic.    Musculoskeletal: Normal range of motion.     Right lower leg: No edema.     Left lower leg: No edema.  Skin:    General: Skin is warm and dry.     Capillary Refill: Capillary refill takes less than 2 seconds.     Findings: No rash.  Neurological:     Mental Status: He is alert and oriented to person, place, and time.     Deep Tendon Reflexes: Reflexes are normal and symmetric.  Psychiatric:        Mood and Affect: Mood normal.        Behavior: Behavior normal. Behavior is cooperative.        Thought Content: Thought content normal.        Judgment: Judgment normal.     Results for orders placed or performed in visit on 10/30/18  UA/M w/rflx Culture, Routine   Specimen: Urine   URINE  Result Value Ref Range   Specific Gravity, UA 1.010 1.005 - 1.030   pH, UA 7.0 5.0 - 7.5   Color, UA Yellow Yellow   Appearance Ur Clear Clear   Leukocytes,UA Negative Negative   Protein,UA Negative Negative/Trace   Glucose, UA Negative Negative   Ketones, UA Negative Negative   RBC, UA Negative Negative   Bilirubin, UA Negative Negative   Urobilinogen, Ur 0.2 0.2 - 1.0 mg/dL   Nitrite, UA Negative Negative      Assessment & Plan:   Problem List Items Addressed This Visit      Other   Abdominal pain - Primary    Intermittent with fluctuating stool pattern.  UA negative. Suspect some element of IBS involved.  Obtain CBC and CMP.  Recommend daily probiotic and keeping food journal to document stool pattern and foods that may cause increased discomfort.  Return in one week for follow-up.  If  ongoing issues consider imaging.      Relevant Orders   UA/M w/rflx Culture, Routine (Completed)   CBC with Differential/Platelet   Comprehensive metabolic panel       Follow up plan: Return in about 1 week (around 11/06/2018) for Abdominal pain follow-up.

## 2018-10-31 LAB — CBC WITH DIFFERENTIAL/PLATELET
Basophils Absolute: 0.1 10*3/uL (ref 0.0–0.2)
Basos: 1 %
EOS (ABSOLUTE): 0.1 10*3/uL (ref 0.0–0.4)
Eos: 1 %
Hematocrit: 48.4 % (ref 37.5–51.0)
Hemoglobin: 16.3 g/dL (ref 13.0–17.7)
Immature Grans (Abs): 0 10*3/uL (ref 0.0–0.1)
Immature Granulocytes: 0 %
Lymphocytes Absolute: 1.7 10*3/uL (ref 0.7–3.1)
Lymphs: 20 %
MCH: 29.6 pg (ref 26.6–33.0)
MCHC: 33.7 g/dL (ref 31.5–35.7)
MCV: 88 fL (ref 79–97)
Monocytes Absolute: 0.6 10*3/uL (ref 0.1–0.9)
Monocytes: 8 %
Neutrophils Absolute: 5.6 10*3/uL (ref 1.4–7.0)
Neutrophils: 70 %
Platelets: 288 10*3/uL (ref 150–450)
RBC: 5.51 x10E6/uL (ref 4.14–5.80)
RDW: 13.1 % (ref 11.6–15.4)
WBC: 8.1 10*3/uL (ref 3.4–10.8)

## 2018-10-31 LAB — COMPREHENSIVE METABOLIC PANEL
ALT: 31 IU/L (ref 0–44)
AST: 26 IU/L (ref 0–40)
Albumin/Globulin Ratio: 2.1 (ref 1.2–2.2)
Albumin: 5.1 g/dL (ref 4.1–5.2)
Alkaline Phosphatase: 98 IU/L (ref 39–117)
BUN/Creatinine Ratio: 13 (ref 9–20)
BUN: 12 mg/dL (ref 6–20)
Bilirubin Total: 0.6 mg/dL (ref 0.0–1.2)
CO2: 26 mmol/L (ref 20–29)
Calcium: 10.4 mg/dL — ABNORMAL HIGH (ref 8.7–10.2)
Chloride: 99 mmol/L (ref 96–106)
Creatinine, Ser: 0.96 mg/dL (ref 0.76–1.27)
GFR calc Af Amer: 130 mL/min/{1.73_m2} (ref >59)
GFR calc non Af Amer: 113 mL/min/{1.73_m2} (ref >59)
Globulin, Total: 2.4 g/dL (ref 1.5–4.5)
Glucose: 72 mg/dL (ref 65–99)
Potassium: 4.6 mmol/L (ref 3.5–5.2)
Sodium: 141 mmol/L (ref 134–144)
Total Protein: 7.5 g/dL (ref 6.0–8.5)

## 2018-10-31 NOTE — Progress Notes (Signed)
Normal test results noted.  Please call patient and make them aware of normal results and will continue to monitor at regular visits.  Have a great day.  Look forward to seeing you at your next visit.

## 2018-11-08 ENCOUNTER — Ambulatory Visit: Payer: PRIVATE HEALTH INSURANCE | Admitting: Nurse Practitioner

## 2018-11-26 IMAGING — US US SCROTUM W/ DOPPLER COMPLETE
1 series · 13 of 25 positions shown · non-contrast
Comparison: None.

CLINICAL DATA: 22-year-old male with left testicle pain and
bilateral testicular swelling for 1 day. Burning with urination for
2 days.

EXAM:
SCROTAL ULTRASOUND
DOPPLER ULTRASOUND OF THE TESTICLES
TECHNIQUE: Complete ultrasound examination of the testicles, epididymis, and
other scrotal structures was performed. Color and spectral Doppler
ultrasound were also utilized to evaluate blood flow to the
testicles.

[Series 1: us scrotum w/ doppler complete · 0.08mm/px · 13 of 62 slices shown]
[im 1/62]
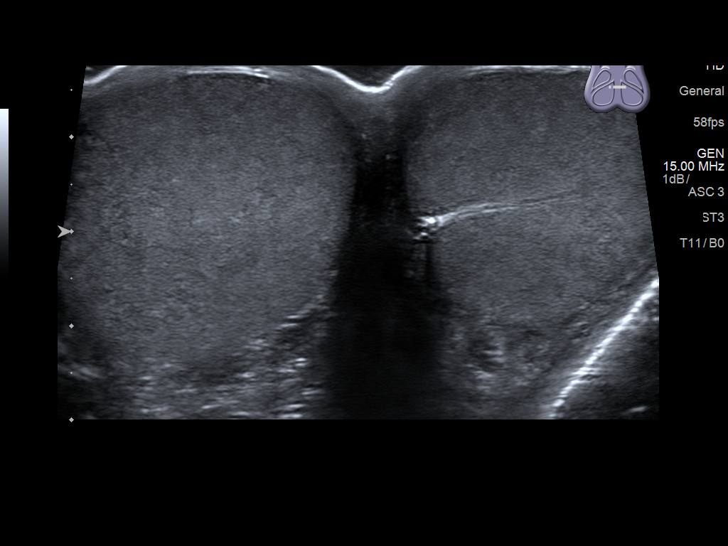
[im 6/62]
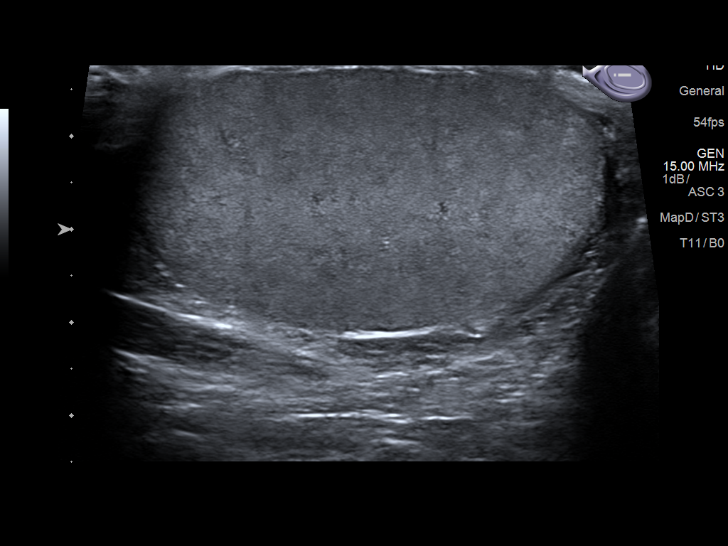
[im 11/62]
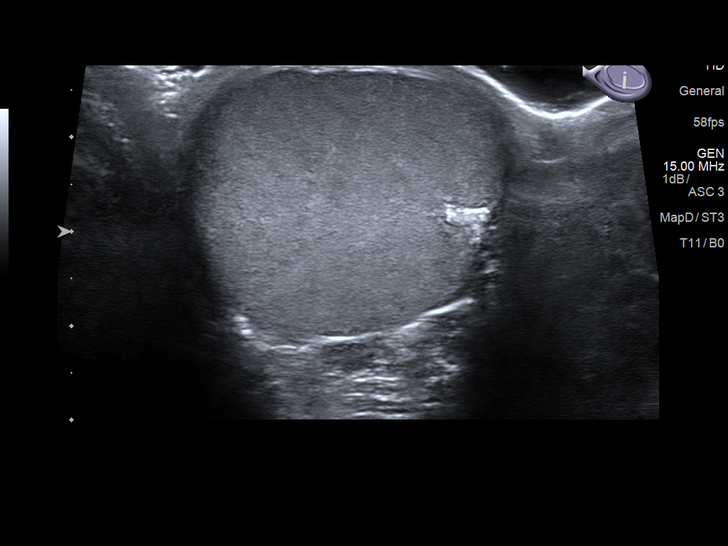
[im 16/62]
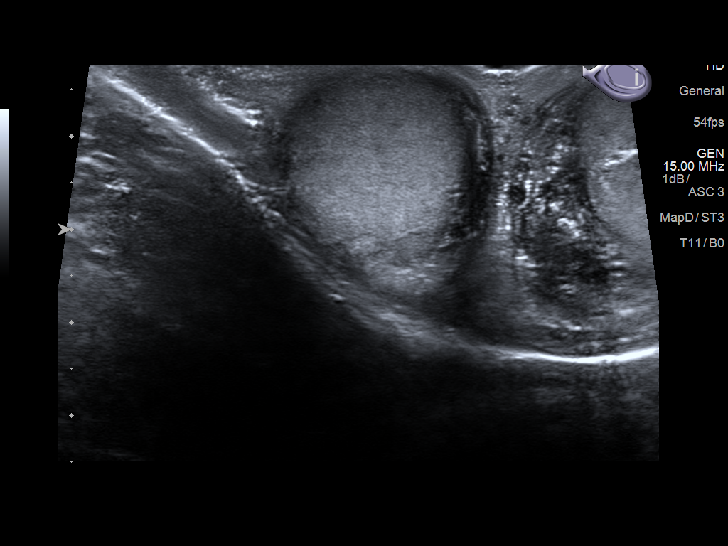
[im 21/62]
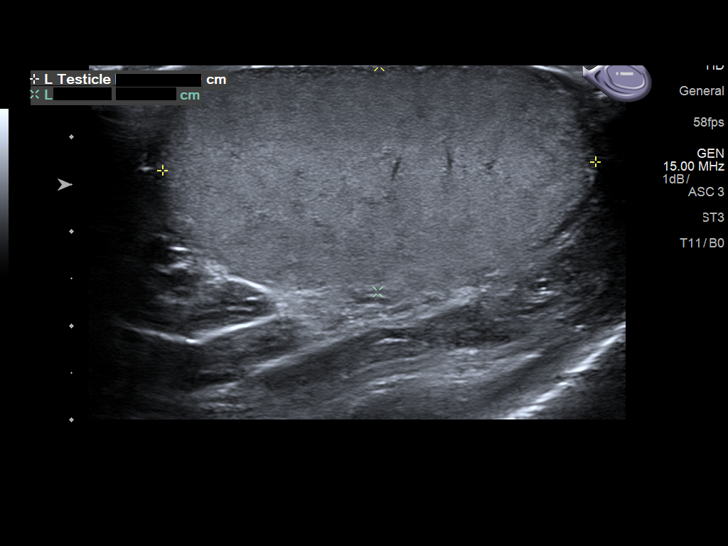
[im 26/62]
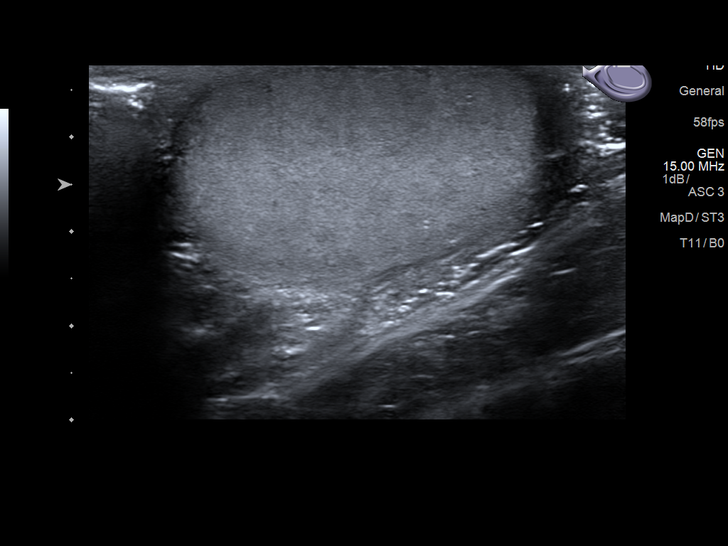
[im 31/62]
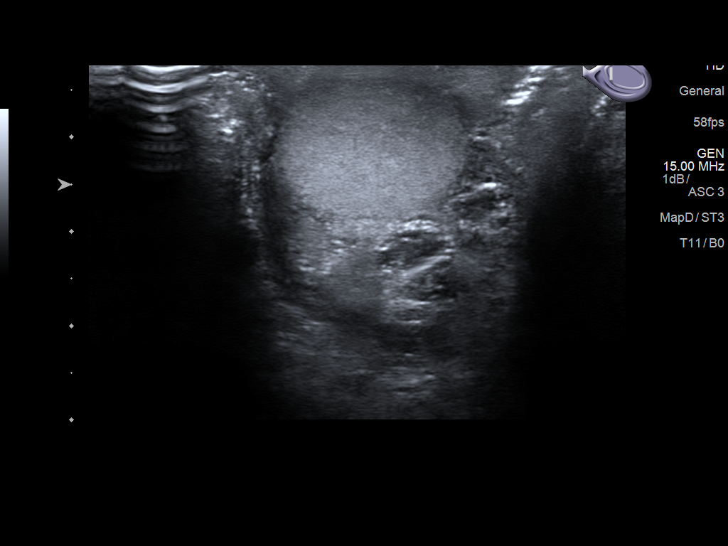
[im 36/62]
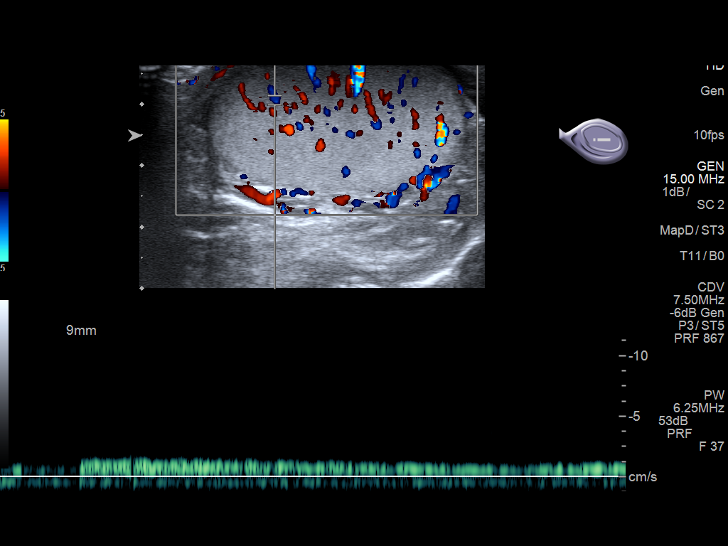
[im 41/62]
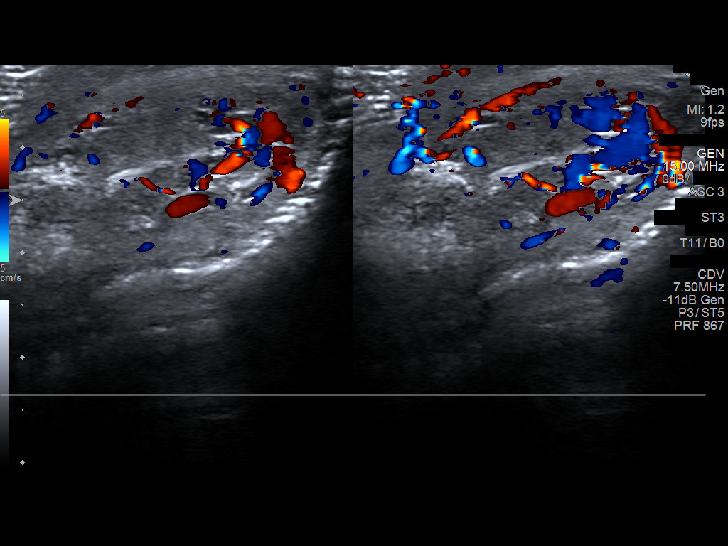
[im 46/62]
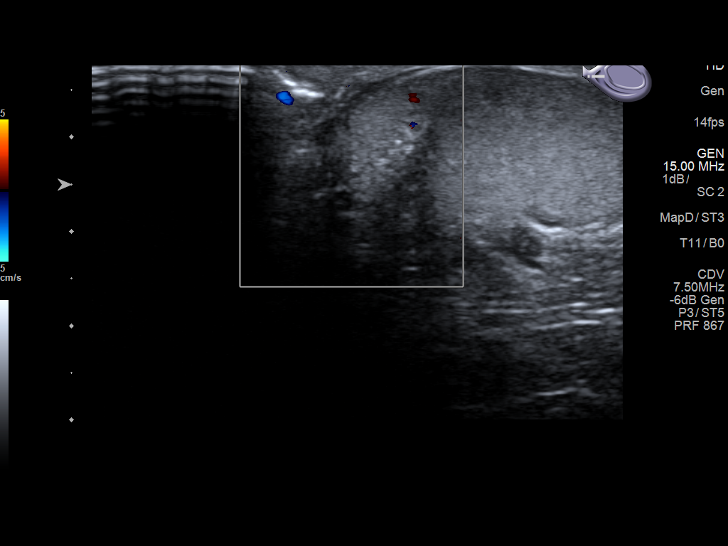
[im 51/62]
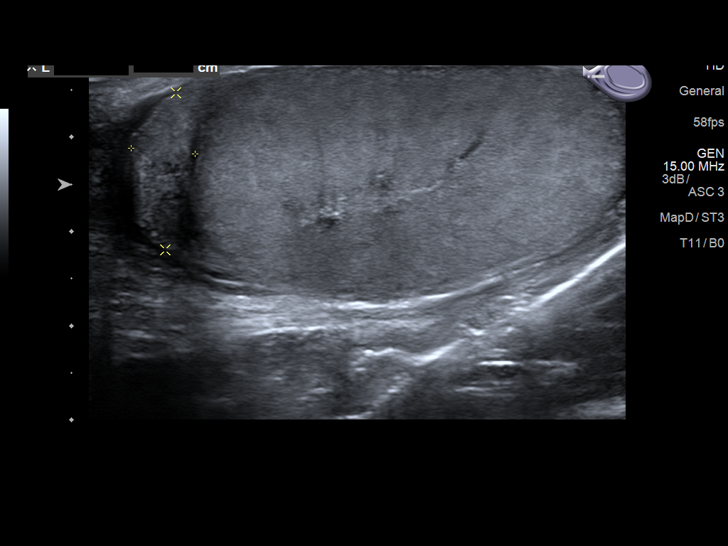
[im 56/62]
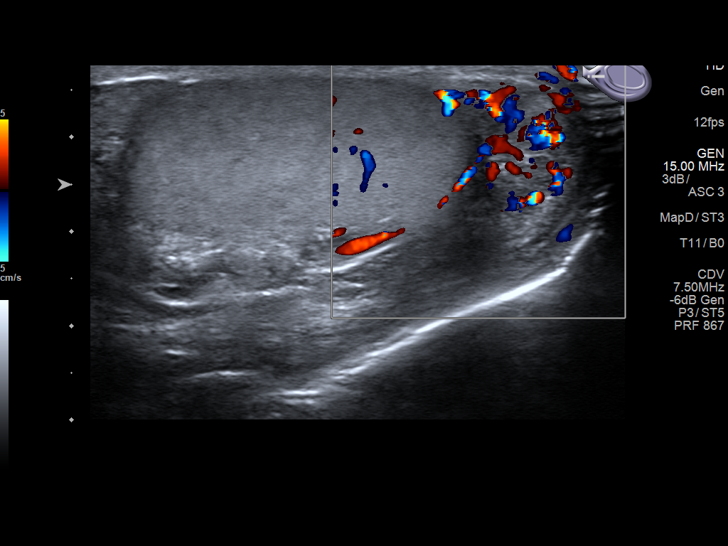
[im 62/62]
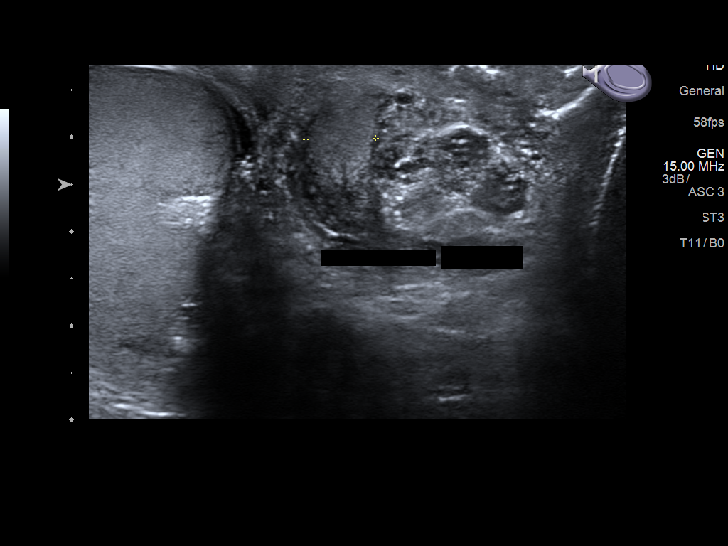

[13 of 25 positions shown; findings below may reference images not displayed]

FINDINGS: Right testicle

Measurements: 5.0 x 2.8 x 3.3 centimeters. No mass or microlithiasis
visualized.

Left testicle

Measurements: 4.6 x 2.4 x 3.1 centimeters.. No mass or
microlithiasis visualized.

Right epididymis: Normal in size and appearance. No hypervascularity
(image 49).

Left epididymis: Enlarged and heterogeneous left epididymis tail
(images 58 through 61) with hypervascularity.

Hydrocele:  None visualized.

Varicocele:  None visualized.

Pulsed Doppler interrogation of both testes demonstrates low
resistance arterial and venous waveforms bilaterally, with
questionable bilateral testicular hypervascularity.
IMPRESSION: 1. Enlarged and hypervascular left epididymis compatible with Acute
Epididymitis.
2. Questionable associated mild acute bilateral orchitis, as
evidenced by possible bilateral testicular hypervascularity.
3. Negative for testicular torsion, testicular mass, or hydrocele.

## 2018-12-24 ENCOUNTER — Other Ambulatory Visit: Payer: Self-pay

## 2018-12-24 ENCOUNTER — Ambulatory Visit
Admission: EM | Admit: 2018-12-24 | Discharge: 2018-12-24 | Disposition: A | Discharge status: Home / Self Care | Payer: No Typology Code available for payment source | Attending: Physician Assistant | Admitting: Physician Assistant

## 2018-12-24 ENCOUNTER — Inpatient Hospital Stay
Admission: RE | Admit: 2018-12-24 | Discharge: 2018-12-24 | Disposition: A | Discharge status: Home / Self Care | Payer: No Typology Code available for payment source | Source: Ambulatory Visit

## 2018-12-24 ENCOUNTER — Encounter: Payer: Self-pay | Admitting: Emergency Medicine

## 2018-12-24 DIAGNOSIS — R300 Dysuria: Secondary | ICD-10-CM | POA: Diagnosis not present

## 2018-12-24 LAB — POCT URINALYSIS DIP (MANUAL ENTRY)
Bilirubin, UA: NEGATIVE
Glucose, UA: NEGATIVE mg/dL
Ketones, POC UA: NEGATIVE mg/dL
Leukocytes, UA: NEGATIVE
Nitrite, UA: NEGATIVE
Protein Ur, POC: NEGATIVE mg/dL
Spec Grav, UA: 1.01 (ref 1.010–1.025)
Urobilinogen, UA: 0.2 E.U./dL
pH, UA: 6.5 (ref 5.0–8.0)

## 2018-12-24 NOTE — ED Provider Notes (Signed)
[facility]    CSN: [id number] Arrival date & time: 12/24/18  1202      History   Chief Complaint Chief Complaint  Patient presents with  . Dysuria    HPI Bernard Price is a 23 y.o. male.   24 year old male comes in for 3 day history of dysuria. Denies penile discharge, penile lesion, testicular swelling, testicular pain. Denies urinary frequency, dysuria. Denies fever, chills, body aches. Denies abdominal pain, nausea, vomiting. He is sexually active with one male partner, no consistent condom use. Does engage in anal sex.      Past Medical History:  Diagnosis Date  . Asthma    as a child    Patient Active Problem List   Diagnosis Date Noted  . Abdominal pain 10/30/2018    History reviewed. No pertinent surgical history.     Home Medications    Prior to Admission medications   Not on File    Family History Family History  Problem Relation Age of Onset  . Hypertension Mother   . Diabetes Mother   . Diabetes Maternal Grandmother   . Hypertension Maternal Grandmother   . Hypertension Maternal Grandfather   . Hypertension Paternal Grandmother   . Diabetes Paternal Grandmother     Social History Social History   Tobacco Use  . Smoking status: Never Smoker  . Smokeless tobacco: Never Used  Substance Use Topics  . Alcohol use: No    Alcohol/week: 0.0 standard drinks  . Drug use: Never     Allergies   Patient has no known allergies.   Review of Systems Review of Systems  Reason unable to perform ROS: See HPI as above.     Physical Exam Triage Vital Signs ED Triage Vitals  Enc Vitals Group     BP 12/24/18 1208 106/73     Pulse Rate 12/24/18 1208 62     Resp 12/24/18 1208 16     Temp 12/24/18 1208 97.6 F (36.4 C)     Temp Source 12/24/18 1208 Oral     SpO2 12/24/18 1208 98 %     Weight --      Height --      Head Circumference --      Peak Flow --      Pain Score 12/24/18 1209 0     Pain Loc --      Pain Edu? --       Excl. in GC? --    No data found.  Updated Vital Signs BP 106/73 (BP Location: Left Arm)   Pulse 62   Temp 97.6 F (36.4 C) (Oral)   Resp 16   SpO2 98%   Physical Exam Exam conducted with a chaperone present.  Constitutional:      General: He is not in acute distress.    Appearance: Normal appearance. He is well-developed. He is not diaphoretic.  HENT:     Head: Normocephalic and atraumatic.  Eyes:     Conjunctiva/sclera: Conjunctivae normal.     Pupils: Pupils are equal, round, and reactive to light.  Pulmonary:     Effort: Pulmonary effort is normal. No respiratory distress.  Genitourinary:    Penis: Circumcised.      Scrotum/Testes: Normal.     Epididymis:     Right: Normal.     Left: Normal.  Neurological:     Mental Status: He is alert and oriented to person, place, and time.      UC Treatments /  Results  Labs (all labs ordered are listed, but only abnormal results are displayed) Labs Reviewed  POCT URINALYSIS DIP (MANUAL ENTRY) - Abnormal; Notable for the following components:      Result Value   Blood, UA trace-intact (*)    All other components within normal limits  HIV ANTIBODY (ROUTINE TESTING W REFLEX)  RPR  CYTOLOGY, (ORAL, ANAL, URETHRAL) ANCILLARY ONLY    EKG   Radiology No results found.  Procedures Procedures (including critical care time)  Medications Ordered in UC Medications - No data to display  Initial Impression / Assessment and Plan / UC Course  I have reviewed the triage vital signs and the nursing notes.  Pertinent labs & imaging results that were available during my care of the patient were reviewed by me and considered in my medical decision making (see chart for details).    Urine negative for infection, does show trace blood. Discussed possibility of small kidney stone, will have patient push fluids. Will send of cytology, HIV, RPR testing. Return precautions given. Patient expresses understanding and agrees to plan.   Final Clinical Impressions(s) / UC Diagnoses   Final diagnoses:  Dysuria   ED Prescriptions    None     PDMP not reviewed this encounter.   Ok Edwards, PA-C 12/24/18 1238

## 2018-12-24 NOTE — ED Notes (Signed)
Patient able to ambulate independently  

## 2018-12-24 NOTE — Discharge Instructions (Addendum)
No signs of urinary tract infection. As discussed, trace blood may indicate small kidney stone. STD testing sent, you will be contacted with any positive results. Refrain from sexual activity and alcohol use for the next 7 days. Keep hydrated, urine should be clear to pale yellow in color. If STD results negative but continues to experience burning despite increase in water intake, follow up with urologist for further evaluation needed. If experiencing testicle swelling/pain, penile lesion/sore, follow up for reevaluation needed.

## 2018-12-24 NOTE — ED Triage Notes (Signed)
Pt presents to [facility] for assessment of burning with urination x 3 days.  Patient denies discharge.  Pt is sexually active, does not always use protection.

## 2018-12-26 LAB — HIV ANTIBODY (ROUTINE TESTING W REFLEX): HIV Screen 4th Generation wRfx: NONREACTIVE

## 2018-12-26 LAB — RPR: RPR Ser Ql: NONREACTIVE

## 2018-12-27 LAB — CYTOLOGY, (ORAL, ANAL, URETHRAL) ANCILLARY ONLY
Chlamydia: NEGATIVE
Neisseria Gonorrhea: NEGATIVE
Trichomonas: NEGATIVE

## 2019-03-02 ENCOUNTER — Encounter: Payer: PRIVATE HEALTH INSURANCE | Admitting: Family Medicine

## 2020-02-08 ENCOUNTER — Other Ambulatory Visit: Payer: Self-pay

## 2020-02-08 ENCOUNTER — Encounter: Payer: Self-pay | Admitting: Nurse Practitioner

## 2020-02-08 ENCOUNTER — Telehealth (INDEPENDENT_AMBULATORY_CARE_PROVIDER_SITE_OTHER): Payer: Commercial Managed Care - PPO | Admitting: Nurse Practitioner

## 2020-02-08 VITALS — Wt 230.0 lb

## 2020-02-08 DIAGNOSIS — R0981 Nasal congestion: Secondary | ICD-10-CM

## 2020-02-08 MED ORDER — FLUTICASONE PROPIONATE 50 MCG/ACT NA SUSP: 2.0000 | Freq: Every day | NASAL | 6 refills | Status: DC

## 2020-02-08 MED ORDER — LORATADINE 10 MG PO TABS: 10.0000 mg | ORAL_TABLET | Freq: Every day | ORAL | 3 refills | Status: DC

## 2020-02-08 NOTE — Patient Instructions (Signed)

## 2020-02-08 NOTE — Assessment & Plan Note (Addendum)
Acute, ongoing.  Suspect allergic rhinitis, could also be viral illness.  We will continue supportive care with hydration, rest, and Mucinex.  Also start antihistamine and intranasal corticosteroid spray.  Encouraged Covid testing.  If symptoms persist until midweek next week, return to clinic for further evaluation.

## 2020-02-08 NOTE — Progress Notes (Signed)
Wt 230 lb (104.3 kg)   BMI 32.81 kg/m    Subjective:    Patient ID: Bernard Price, male    DOB: 10-21-1995, 24 y.o.   MRN: [id number]  HPI: [name] is a 24 y.o. male presenting for URI.  Chief Complaint  Patient presents with  . URI    pt states he started having a sore throat last Sunday, states that is a little better now. States the main 2 symptoms now are runny nose and drainage. States he has been taking mucinex all week.    UPPER RESPIRATORY TRACT INFECTION Onset: 11/14; Sunday - throat was sore and burning Worst symptom: running nose and drainage Fever: no Cough: yes; dry Shortness of breath: no Wheezing: yes Chest pain: no Chest tightness: no Chest congestion: no Nasal congestion: no Runny nose: yes Post nasal drip: yes Sneezing: yes Sore throat: no; was first couple of days Swollen glands: no Sinus pressure: no Headache: no Face pain: no Toothache: no Ear pain: no  Ear pressure: no  Eyes red/itching:no Eye drainage/crusting: yes; when sneezing eyes water Nausea: no Vomiting: no Diarrhea: no Change in appetite: no Loss of taste/smell: no Rash: no Fatigue: yes Sick contacts: no Strep contacts: no  Context: better Recurrent sinusitis: no Treatments attempted: Mucinex Relief with OTC medications: yes  No Known Allergies  Outpatient Encounter Medications as of 02/08/2020  Medication Sig  . fluticasone (FLONASE) 50 MCG/ACT nasal spray Place 2 sprays into both nostrils daily.  Marland Kitchen loratadine (CLARITIN) 10 MG tablet Take 1 tablet (10 mg total) by mouth daily.   No facility-administered encounter medications on file as of 02/08/2020.   Patient Active Problem List   Diagnosis Date Noted  . Nasal congestion 02/08/2020  . Abdominal pain 10/30/2018   Past Medical History:  Diagnosis Date  . Asthma    as a child   Relevant past medical, surgical, family and social history reviewed and updated as indicated. Interim medical history since our  last visit reviewed.  Review of Systems  Constitutional: Positive for fatigue. Negative for activity change, appetite change, chills and fever.  HENT: Positive for rhinorrhea. Negative for congestion, ear discharge, ear pain, hearing loss, mouth sores, postnasal drip, sinus pressure, sinus pain, sneezing and trouble swallowing.   Eyes: Positive for discharge (clear, watery). Negative for pain, redness, itching and visual disturbance.  Respiratory: Positive for cough (dry cough) and wheezing. Negative for choking and shortness of breath.   Cardiovascular: Negative.  Negative for chest pain and leg swelling.  Gastrointestinal: Negative.  Negative for nausea and vomiting.  Skin: Negative.  Negative for rash.  Neurological: Negative.  Negative for dizziness, light-headedness and headaches.  Psychiatric/Behavioral: Negative.     Per HPI unless specifically indicated above     Objective:    Wt 230 lb (104.3 kg)   BMI 32.81 kg/m   Wt Readings from Last 3 Encounters:  02/08/20 230 lb (104.3 kg)  10/30/18 234 lb (106.1 kg)  02/24/18 206 lb (93.4 kg)    Physical Exam Vitals and nursing note reviewed.  Constitutional:      General: He is not in acute distress.    Appearance: Normal appearance. He is not ill-appearing or toxic-appearing.  HENT:     Head: Normocephalic and atraumatic.     Right Ear: External ear normal.     Left Ear: External ear normal.     Nose: Rhinorrhea present. No congestion.     Mouth/Throat:     Mouth: Mucous  membranes are moist.     Pharynx: Oropharynx is clear.  Eyes:     General: No scleral icterus.       Right eye: No discharge.        Left eye: No discharge.     Extraocular Movements: Extraocular movements intact.  Cardiovascular:     Comments: Unable to assess heart sounds via virtual visit. Pulmonary:     Effort: Pulmonary effort is normal. No respiratory distress.     Comments: Unable to assess lung sounds via virtual visit.  Patient talking in  complete sentences.  No accessory muscle use. Abdominal:     Comments: Unable to assess bowel sounds via virtual visit.  Skin:    Coloration: Skin is not jaundiced or pale.     Findings: No erythema.  Neurological:     Mental Status: He is alert and oriented to person, place, and time.  Psychiatric:        Mood and Affect: Mood normal.        Behavior: Behavior normal.        Thought Content: Thought content normal.        Judgment: Judgment normal.        Assessment & Plan:   Problem List Items Addressed This Visit      Other   Nasal congestion - Primary    Acute, ongoing.  Suspect allergic rhinitis, could also be viral illness.  We will continue supportive care with hydration, rest, and Mucinex.  Also start antihistamine and intranasal corticosteroid spray.  Encouraged Covid testing.  If symptoms persist until midweek next week, return to clinic for further evaluation.          Follow up plan: Return if symptoms worsen or fail to improve.   Due to the catastrophic nature of the COVID-19 pandemic, this visit was completed via audio and visual contact via [employer] due to the restrictions of the COVID-19 pandemic. All issues as above were discussed and addressed. Physical exam was done as above through visual confirmation on [employer]. If it was felt that the patient should be evaluated in the office, they were directed there. The patient verbally consented to this visit."} . Location of the patient: home . Location of the provider: work . Those involved with this call:  . Provider: Mardene Celeste, DNP . CMA: Wilhemena Durie, CMA . Front Desk/Registration: Harriet Pho  . Time spent on call: 11 minutes with patient face to face via video conference. More than 50% of this time was spent in counseling and coordination of care. 30 minutes total spent in review of patient's record and preparation of their chart.  I verified patient identity using two factors (patient name and  date of birth). Patient consents verbally to being seen via telemedicine visit today.

## 2020-05-28 ENCOUNTER — Telehealth: Payer: No Typology Code available for payment source | Admitting: Family

## 2020-05-28 ENCOUNTER — Telehealth: Payer: Commercial Managed Care - PPO | Admitting: Family

## 2020-05-28 DIAGNOSIS — J019 Acute sinusitis, unspecified: Secondary | ICD-10-CM | POA: Diagnosis not present

## 2020-05-28 MED ORDER — AMOXICILLIN-POT CLAVULANATE 875-125 MG PO TABS: 1.0000 | ORAL_TABLET | Freq: Two times a day (BID) | ORAL | 0 refills | Status: DC

## 2020-05-28 NOTE — Progress Notes (Signed)
Pt already completed an Evisit. Discussed starting that medication and getting a new toothbrush in 3 days.   Jannifer Rodney, FNP

## 2020-05-28 NOTE — Progress Notes (Signed)

## 2020-06-02 ENCOUNTER — Other Ambulatory Visit: Payer: Self-pay | Admitting: Family

## 2020-06-15 ENCOUNTER — Encounter: Payer: Self-pay | Admitting: Family Medicine

## 2020-08-25 ENCOUNTER — Encounter: Payer: Self-pay | Admitting: Physician Assistant

## 2020-08-25 ENCOUNTER — Telehealth: Payer: Commercial Managed Care - PPO | Admitting: Physician Assistant

## 2020-08-25 DIAGNOSIS — U071 COVID-19: Secondary | ICD-10-CM

## 2020-08-25 MED ORDER — BENZONATATE 100 MG PO CAPS: 100.0000 mg | ORAL_CAPSULE | Freq: Three times a day (TID) | ORAL | 0 refills | Status: DC | PRN

## 2020-08-25 MED ORDER — IPRATROPIUM BROMIDE 0.06 % NA SOLN: 2.0000 | Freq: Four times a day (QID) | NASAL | 12 refills | Status: DC

## 2020-08-25 NOTE — Patient Instructions (Signed)
Hello Bernard "Hunter",  You are being placed in the home monitoring program for COVID-19 (commonly known as Coronavirus).  This is because you are suspected to have the virus or are known to have the virus.  If you are unsure which group you fall into call your clinic.    As part of this program, you'll answer a daily questionnaire in the MyChart mobile app. You'll receive a notification through the MyChart app when the questionnaire is available. When you log in to MyChart, you'll see the tasks in your To Do activity.       Clinicians will see any answers that are concerning and take appropriate steps.  If at any point you are having a medical emergency, call 911.  If otherwise concerned call your clinic instead of coming into the clinic or hospital.  To keep from spreading the disease you should: Stay home and limit contact with other people as much as possible.  Wash your hands frequently. Cover your coughs and sneezes with a tissue, and throw used tissues in the trash.   Clean and disinfect frequently touched surfaces and objects.    Take care of yourself by: Staying home Resting Drinking fluids Take fever-reducing medications (Tylenol/Acetaminophen and Ibuprofen)  For more information on the disease go to the Centers for Disease Control and Prevention website     Can take to lessen severity: Vit C 500mg  twice daily Quercertin 250-500mg  twice daily Zinc 75-100mg  daily Melatonin 3-6 mg at bedtime Vit D3 1000-2000 IU daily Aspirin 81 mg daily with food Optional: Famotidine 20mg  daily Also can add tylenol/ibuprofen as needed for fevers and body aches May add Mucinex or Mucinex DM as needed for cough/congestion  10 Things You Can Do to Manage Your COVID-19 Symptoms at Home If you have possible or confirmed COVID-19: 1. Stay home except to get medical care. 2. Monitor your symptoms carefully. If your symptoms get worse, call your healthcare provider immediately. 3. Get rest  and stay hydrated. 4. If you have a medical appointment, call the healthcare provider ahead of time and tell them that you have or may have COVID-19. 5. For medical emergencies, call 911 and notify the dispatch personnel that you have or may have COVID-19. 6. Cover your cough and sneezes with a tissue or use the inside of your elbow. 7. Wash your hands often with soap and water for at least 20 seconds or clean your hands with an alcohol-based hand sanitizer that contains at least 60% alcohol. 8. As much as possible, stay in a specific room and away from other people in your home. Also, you should use a separate bathroom, if available. If you need to be around other people in or outside of the home, wear a mask. 9. Avoid sharing personal items with other people in your household, like dishes, towels, and bedding. 10. Clean all surfaces that are touched often, like counters, tabletops, and doorknobs. Use household cleaning sprays or wipes according to the label instructions. 10/05/2019 This information is not intended to replace advice given to you by your health care provider. Make sure you discuss any questions you have with your health care provider. Document Revised: 01/21/2020 Document Reviewed: 01/21/2020 Elsevier Patient Education  2021 Elsevier Inc.   COVID-19: What to Do if You Are Sick If you have a fever, cough or other symptoms, you might have COVID-19. Most people have mild illness and are able to recover at home. If you are sick:  Keep track of your  symptoms.  If you have an emergency warning sign (including trouble breathing), call 911. Steps to help prevent the spread of COVID-19 if you are sick If you are sick with COVID-19 or think you might have COVID-19, follow the steps below to care for yourself and to help protect other people in your home and community. Stay home except to get medical care  Stay home. Most people with COVID-19 have mild illness and  can recover at home without medical care. Do not leave your home, except to get medical care. Do not visit public areas.  Take care of yourself. Get rest and stay hydrated. Take over-the-counter medicines, such as acetaminophen, to help you feel better.  Stay in touch with your doctor. Call before you get medical care. Be sure to get care if you have trouble breathing, or have any other emergency warning signs, or if you think it is an emergency.  Avoid public transportation, ride-sharing, or taxis. Separate yourself from other people As much as possible, stay in a specific room and away from other people and pets in your home. If possible, you should use a separate bathroom. If you need to be around other people or animals in or outside of the home, wear a mask. Tell your close contactsthat they may have been exposed to COVID-19. An infected person can spread COVID-19 starting 48 hours (or 2 days) before the person has any symptoms or tests positive. By letting your close contacts know they may have been exposed to COVID-19, you are helping to protect everyone.  Additional guidance is available for those living in close quarters and shared housing.  See COVID-19 and Animals if you have questions about pets.  If you are diagnosed with COVID-19, someone from the health department may call you. Answer the call to slow the spread. Monitor your symptoms  Symptoms of COVID-19 include fever, cough, or other symptoms.  Follow care instructions from your healthcare provider and local health department. Your local health authorities may give instructions on checking your symptoms and reporting information. When to seek emergency medical attention Look for emergency warning signs* for COVID-19. If someone is showing any of these signs, seek emergency medical care immediately:  Trouble breathing  Persistent pain or pressure in the chest  New confusion  Inability to wake or stay awake  Pale,  gray, or blue-colored skin, lips, or nail beds, depending on skin tone *This list is not all possible symptoms. Please call your medical provider for any other symptoms that are severe or concerning to you. Call 911 or call ahead to your local emergency facility: Notify the operator that you are seeking care for someone who has or may have COVID-19. Call ahead before visiting your doctor  Call ahead. Many medical visits for routine care are being postponed or done by phone or telemedicine.  If you have a medical appointment that cannot be postponed, call your doctor's office, and tell them you have or may have COVID-19. This will help the office protect themselves and other patients. Get  tested  If you have symptoms of COVID-19, get tested. While waiting for test results, you stay away from others, including staying apart from those living in your household.  You can visit your state, tribal, local, and territorialhealth department's website to look for the latest local information on testing sites. If you are sick, wear a mask over your nose and mouth  You should wear a mask over your nose and mouth if you must  be around other people or animals, including pets (even at home).  You don't need to wear the mask if you are alone. If you can't put on a mask (because of trouble breathing, for example), cover your coughs and sneezes in some other way. Try to stay at least 6 feet away from other people. This will help protect the people around you.  Masks should not be placed on young children under age 7 years, anyone who has trouble breathing, or anyone who is not able to remove the mask without help. Note: During the COVID-19 pandemic, medical grade facemasks are reserved for healthcare workers and some first responders. Cover your coughs and sneezes  Cover your mouth and nose with a tissue when you cough or sneeze.  Throw away used tissues in a lined trash can.  Immediately wash your hands  with soap and water for at least 20 seconds. If soap and water are not available, clean your hands with an alcohol-based hand sanitizer that contains at least 60% alcohol. Clean your hands often  Wash your hands often with soap and water for at least 20 seconds. This is especially important after blowing your nose, coughing, or sneezing; going to the bathroom; and before eating or preparing food.  Use hand sanitizer if soap and water are not available. Use an alcohol-based hand sanitizer with at least 60% alcohol, covering all surfaces of your hands and rubbing them together until they feel dry.  Soap and water are the best option, especially if hands are visibly dirty.  Avoid touching your eyes, nose, and mouth with unwashed hands.  Handwashing Tips Avoid sharing personal household items  Do not share dishes, drinking glasses, cups, eating utensils, towels, or bedding with other people in your home.  Wash these items thoroughly after using them with soap and water or put in the dishwasher. Clean all "high-touch" surfaces everyday  Clean and disinfect high-touch surfaces in your "sick room" and bathroom; wear disposable gloves. Let someone else clean and disinfect surfaces in common areas, but you should clean your bedroom and bathroom, if possible.  If a caregiver or other person needs to clean and disinfect a sick person's bedroom or bathroom, they should do so on an as-needed basis. The caregiver/other person should wear a mask and disposable gloves prior to cleaning. They should wait as long as possible after the person who is sick has used the bathroom before coming in to clean and use the bathroom. ? High-touch surfaces include phones, remote controls, counters, tabletops, doorknobs, bathroom fixtures, toilets, keyboards, tablets, and bedside tables.  Clean and disinfect areas that may have blood, stool, or body fluids on them.  Use household cleaners and disinfectants. Clean the area  or item with soap and water or another detergent if it is dirty. Then, use a household disinfectant. ? Be sure to follow the instructions on the label to ensure safe and effective use of the product. Many products recommend keeping the surface wet for several minutes to ensure germs are killed. Many also recommend precautions such as wearing gloves and making sure you have good ventilation during use of the product. ? Use a product from [employer] List N: Disinfectants for Coronavirus (COVID-19). ? Complete Disinfection Guidance When you can be around others after being sick with COVID-19 Deciding when you can be around others is different for different situations. Find out when you can safely end home isolation. For any additional questions about your care, contact your healthcare provider or state  or local health department. 06/06/2019 Content source: [facility] for Immunization and Respiratory Diseases ([facility]), Division of Viral Diseases This information is not intended to replace advice given to you by your health care provider. Make sure you discuss any questions you have with your health care provider. Document Revised: 01/21/2020 Document Reviewed: 01/21/2020 Elsevier Patient Education  2021 [employer].

## 2020-08-25 NOTE — Progress Notes (Signed)
Mr. haven, foss are scheduled for a virtual visit with your provider today.    Just as we do with appointments in the office, we must obtain your consent to participate.  Your consent will be active for this visit and any virtual visit you may have with one of our providers in the next 365 days.    If you have a MyChart account, I can also send a copy of this consent to you electronically.  All virtual visits are billed to your insurance company just like a traditional visit in the office.  As this is a virtual visit, video technology does not allow for your provider to perform a traditional examination.  This may limit your provider's ability to fully assess your condition.  If your provider identifies any concerns that need to be evaluated in person or the need to arrange testing such as labs, EKG, etc, we will make arrangements to do so.    Although advances in technology are sophisticated, we cannot ensure that it will always work on either your end or our end.  If the connection with a video visit is poor, we may have to switch to a telephone visit.  With either a video or telephone visit, we are not always able to ensure that we have a secure connection.   I need to obtain your verbal consent now.   Are you willing to proceed with your visit today?   Bernard Price has provided verbal consent on 08/25/2020 for a virtual visit (video or telephone).   Margaretann Loveless, PA-C 08/25/2020  9:37 AM    MyChart Video Visit    Virtual Visit via Video Note   This visit type was conducted due to national recommendations for restrictions regarding the COVID-19 Pandemic (e.g. social distancing) in an effort to limit this patient's exposure and mitigate transmission in our community. This patient is at least at moderate risk for complications without adequate follow up. This format is felt to be most appropriate for this patient at this time. Physical exam was limited by quality of the video and audio  technology used for the visit.   Patient location: Home Provider location: Home office in [place]  I discussed the limitations of evaluation and management by telemedicine and the availability of in person appointments. The patient expressed understanding and agreed to proceed.  Patient: Bernard Price   DOB: May 07, 1995   25 y.o. Male  MRN: [id number] Visit Date: 08/25/2020  Today's healthcare provider: Margaretann Loveless, PA-C   No chief complaint on file.  Subjective    URI  This is a new problem. The current episode started in the past 7 days. The problem has been unchanged. There has been no fever. Associated symptoms include congestion, coughing, diarrhea (1st day), headaches, rhinorrhea, sinus pain, sneezing and a sore throat. Pertinent negatives include no abdominal pain, chest pain, ear pain, nausea, plugged ear sensation, vomiting or wheezing. He has tried decongestant (sudafed, tylenol, Mucinex) for the symptoms. The treatment provided mild relief.    Tested positive for Covid 19 on Saturday, symptoms started Thursday. He is vaccinated with 2 shots and a booster, and has had covid once in the past as well.   Patient Active Problem List   Diagnosis Date Noted  . Nasal congestion 02/08/2020  . Abdominal pain 10/30/2018   Past Medical History:  Diagnosis Date  . Asthma    as a child      Medications: Outpatient Medications Prior to Visit  Medication Sig  . amoxicillin-clavulanate (AUGMENTIN) 875-125 MG tablet Take 1 tablet by mouth 2 (two) times daily.  . fluticasone (FLONASE) 50 MCG/ACT nasal spray Place 2 sprays into both nostrils daily.  Marland Kitchen loratadine (CLARITIN) 10 MG tablet Take 1 tablet (10 mg total) by mouth daily.   No facility-administered medications prior to visit.    Review of Systems  Constitutional: Positive for chills and diaphoresis. Negative for fever.  HENT: Positive for congestion, postnasal drip, rhinorrhea, sinus pain, sneezing and sore  throat. Negative for ear pain.   Respiratory: Positive for cough. Negative for chest tightness, shortness of breath and wheezing.   Cardiovascular: Negative for chest pain.  Gastrointestinal: Positive for diarrhea (1st day). Negative for abdominal pain, nausea and vomiting.  Neurological: Positive for headaches.    Last CBC Lab Results  Component Value Date   WBC 8.1 10/30/2018   HGB 16.3 10/30/2018   HCT 48.4 10/30/2018   MCV 88 10/30/2018   MCH 29.6 10/30/2018   RDW 13.1 10/30/2018   PLT 288 10/30/2018   Last metabolic panel Lab Results  Component Value Date   GLUCOSE 72 10/30/2018   NA 141 10/30/2018   K 4.6 10/30/2018   CL 99 10/30/2018   CO2 26 10/30/2018   BUN 12 10/30/2018   CREATININE 0.96 10/30/2018   GFRNONAA 113 10/30/2018   GFRAA 130 10/30/2018   CALCIUM 10.4 (H) 10/30/2018   PROT 7.5 10/30/2018   ALBUMIN 5.1 10/30/2018   LABGLOB 2.4 10/30/2018   AGRATIO 2.1 10/30/2018   BILITOT 0.6 10/30/2018   ALKPHOS 98 10/30/2018   AST 26 10/30/2018   ALT 31 10/30/2018   Last lipids Lab Results  Component Value Date   CHOL 135 02/24/2018   TRIG 127 02/24/2018      Objective    There were no vitals taken for this visit. BP Readings from Last 3 Encounters:  12/24/18 106/73  10/30/18 118/71  02/24/18 107/69   Wt Readings from Last 3 Encounters:  02/08/20 230 lb (104.3 kg)  10/30/18 234 lb (106.1 kg)  02/24/18 206 lb (93.4 kg)      Physical Exam Vitals reviewed.  Constitutional:      General: He is not in acute distress.    Appearance: He is well-developed. He is not ill-appearing.  HENT:     Head: Normocephalic and atraumatic.  Eyes:     Conjunctiva/sclera: Conjunctivae normal.  Pulmonary:     Effort: Pulmonary effort is normal. No respiratory distress (no cough heard, able to speak in full, complete sentences).  Musculoskeletal:     Cervical back: Normal range of motion and neck supple.  Neurological:     Mental Status: He is alert.   Psychiatric:        Mood and Affect: Mood normal.        Behavior: Behavior normal.        Thought Content: Thought content normal.        Judgment: Judgment normal.        Assessment & Plan     1. COVID-19 - Continue OTC symptomatic management of choice - Will send OTC vitamins and supplement information through AVS - Added Ipratropium bromide nasal spray and tessalon perles as below - Patient enrolled in MyChart symptom monitoring - Push fluids - Rest as needed - Discussed return precautions and when to seek in-person evaluation, sent via AVS as well - benzonatate (TESSALON) 100 MG capsule; Take 1 capsule (100 mg total) by mouth 3 (three) times daily  as needed.  Dispense: 30 capsule; Refill: 0 - ipratropium (ATROVENT) 0.06 % nasal spray; Place 2 sprays into both nostrils 4 (four) times daily.  Dispense: 15 mL; Refill: 12 - MyChart COVID-19 home monitoring program; Future - Temperature monitoring; Future  No follow-ups on file.     I discussed the assessment and treatment plan with the patient. The patient was provided an opportunity to ask questions and all were answered. The patient agreed with the plan and demonstrated an understanding of the instructions.   The patient was advised to call back or seek an in-person evaluation if the symptoms worsen or if the condition fails to improve as anticipated.  I provided 15 minutes of face-to-face time during this encounter via MyChart Video enabled encounter.   Reine Just [facility] [phone] (phone) [phone] (fax)  [facility]

## 2021-06-15 ENCOUNTER — Other Ambulatory Visit: Payer: Self-pay

## 2021-06-15 ENCOUNTER — Ambulatory Visit: Payer: BC Managed Care – PPO | Admitting: Podiatry

## 2021-06-15 ENCOUNTER — Encounter: Payer: Self-pay | Admitting: Podiatry

## 2021-06-15 DIAGNOSIS — L600 Ingrowing nail: Secondary | ICD-10-CM | POA: Diagnosis not present

## 2021-06-15 MED ORDER — NEOMYCIN-POLYMYXIN-HC 1 % OT SOLN: OTIC | 1 refills | Status: DC

## 2021-06-15 NOTE — Progress Notes (Signed)
?  Subjective:  ?Patient ID: Bernard Price, male    DOB: [birth year],  MRN: [id number] ?HPI ?Chief Complaint  ?Patient presents with  ? Toe Pain  ?  Hallux bilateral - both borders, ingrown x years, been trimming  ? New Patient (Initial Visit)  ? ? ?26 y.o. male presents with the above complaint.  ? ?ROS: Denies fever chills nausea vomiting muscle aches pains calf pain back pain chest pain shortness of breath. ? ?Past Medical History:  ?Diagnosis Date  ? Asthma   ? as a child  ? ?No past surgical history on file. ? ?Current Outpatient Medications:  ?  NEOMYCIN-POLYMYXIN-HYDROCORTISONE (CORTISPORIN) 1 % SOLN OTIC solution, Apply 1-2 drops to toe BID after soaking, Disp: 10 mL, Rfl: 1 ? ?No Known Allergies ?Review of Systems ?Objective:  ?There were no vitals filed for this visit. ? ?General: Well developed, nourished, in no acute distress, alert and oriented x3  ? ?Dermatological: Skin is warm, dry and supple bilateral. Nails x 10 are well maintained; remaining integument appears unremarkable at this time. There are no open sores, no preulcerative lesions, no rash or signs of infection present.  Sharp incurvated nail margin to [place] border of the hallux bilateral mild erythema no purulence no malodor ? ?Vascular: Dorsalis Pedis artery and Posterior Tibial artery pedal pulses are 2/4 bilateral with immedate capillary fill time. Pedal hair growth present. No varicosities and no lower extremity edema present bilateral.  ? ?Neruologic: Grossly intact via light touch bilateral. Vibratory intact via tuning fork bilateral. Protective threshold with Semmes Wienstein monofilament intact to all pedal sites bilateral. Patellar and Achilles deep tendon reflexes 2+ bilateral. No Babinski or clonus noted bilateral.  ? ?Musculoskeletal: No gross boney pedal deformities bilateral. No pain, crepitus, or limitation noted with foot and ankle range of motion bilateral. Muscular strength 5/5 in all groups tested bilateral. ? ?Gait:  Unassisted, Nonantalgic.  ? ? ?Radiographs: ? ?None taken ? ?Assessment & Plan:  ? ?Assessment: Ingrown toenails hallux bilateral. ? ?Plan: Chemical matricectomy's were performed to bilateral great toes both margins.  He tolerated procedure well after local anesthetic was administered he was given both oral written home-going instruction with care and soaking of the toe as well as a prescription of Cortisporin Otic to be applied twice daily after soaking.  Follow-up with him in 2 weeks call with questions or concerns. ? ? ? ? ?Amiley Shishido T. Henefer, DPM ?

## 2021-06-15 NOTE — Patient Instructions (Signed)

## 2021-06-29 ENCOUNTER — Encounter: Payer: Self-pay | Admitting: Podiatry

## 2021-06-29 ENCOUNTER — Ambulatory Visit: Payer: BC Managed Care – PPO | Admitting: Podiatry

## 2021-06-29 DIAGNOSIS — Z9889 Other specified postprocedural states: Secondary | ICD-10-CM

## 2021-06-29 DIAGNOSIS — L600 Ingrowing nail: Secondary | ICD-10-CM

## 2021-06-29 NOTE — Progress Notes (Signed)
He presents today for follow-up of his nail procedure hallux bilaterally.  States that they are little sore but he thinks they are fine.  States that he just continues to soak. ? ?Objective: Vital signs are stable alert and oriented x3.  There is no erythema edema cellulitis drainage or appears to be healing very nicely.  At this point I think he is doing well. ? ?Assessment: Well-healing surgical toes. ? ?Plan: Continue to soak daily for about another week Epsom salts and warm water then discontinue.  Follow-up with me with any questions or concerns. ?

## 2021-09-11 ENCOUNTER — Encounter: Payer: Self-pay | Admitting: Podiatry

## 2021-09-11 ENCOUNTER — Ambulatory Visit: Payer: BC Managed Care – PPO | Admitting: Podiatry

## 2021-09-11 DIAGNOSIS — L600 Ingrowing nail: Secondary | ICD-10-CM | POA: Diagnosis not present

## 2021-09-17 ENCOUNTER — Encounter: Payer: Self-pay | Admitting: Family Medicine

## 2021-09-17 ENCOUNTER — Ambulatory Visit (INDEPENDENT_AMBULATORY_CARE_PROVIDER_SITE_OTHER): Payer: BC Managed Care – PPO | Admitting: Family Medicine

## 2021-09-17 VITALS — BP 104/67 | HR 66 | Temp 97.5°F | Ht 71.2 in | Wt 262.0 lb

## 2021-09-17 DIAGNOSIS — Z0000 Encounter for general adult medical examination without abnormal findings: Secondary | ICD-10-CM | POA: Diagnosis not present

## 2021-09-17 DIAGNOSIS — F422 Mixed obsessional thoughts and acts: Secondary | ICD-10-CM | POA: Diagnosis not present

## 2021-09-17 DIAGNOSIS — Z23 Encounter for immunization: Secondary | ICD-10-CM | POA: Diagnosis not present

## 2021-09-17 DIAGNOSIS — Z113 Encounter for screening for infections with a predominantly sexual mode of transmission: Secondary | ICD-10-CM | POA: Diagnosis not present

## 2021-09-17 DIAGNOSIS — Z136 Encounter for screening for cardiovascular disorders: Secondary | ICD-10-CM | POA: Diagnosis not present

## 2021-09-17 LAB — URINALYSIS, ROUTINE W REFLEX MICROSCOPIC
Bilirubin, UA: NEGATIVE
Glucose, UA: NEGATIVE
Ketones, UA: NEGATIVE
Leukocytes,UA: NEGATIVE
Nitrite, UA: NEGATIVE
Protein,UA: NEGATIVE
RBC, UA: NEGATIVE
Specific Gravity, UA: 1.01 (ref 1.005–1.030)
Urobilinogen, Ur: 0.2 mg/dL (ref 0.2–1.0)
pH, UA: 7 (ref 5.0–7.5)

## 2021-09-17 MED ORDER — BUPROPION HCL ER (SR) 150 MG PO TB12: ORAL_TABLET | ORAL | 1 refills | Status: DC

## 2021-09-17 NOTE — Assessment & Plan Note (Signed)
Will get started on wellbutrin for mood and weight. Call with any concerns. Continue to monitor. Recheck 1 month.

## 2021-09-17 NOTE — Progress Notes (Signed)
BP 104/67   Pulse 66   Temp (!) 97.5 F (36.4 C) (Oral)   Ht 5' 11.2" (1.808 m)   Wt 262 lb (118.8 kg)   SpO2 98%   BMI 36.34 kg/m    Subjective:    Patient ID: Bernard Price, male    DOB: 1995/12/21, 26 y.o.   MRN: [id number]  HPI: Bernard Price is a 26 y.o. male presenting on 09/17/2021 for comprehensive medical examination. Current medical complaints include:  ANXIETY/STRESS Duration: months Status:worse Anxious mood: yes  Excessive worrying: yes Irritability: no  Sweating: no Nausea: no Palpitations:no Hyperventilation: no Panic attacks: no Agoraphobia: no  Obscessions/compulsions: yes Depressed mood: yes    09/17/2021   10:05 AM 02/08/2020    9:00 AM 02/24/2018    2:47 PM  Depression screen PHQ 2/9  Decreased Interest 1 0 1  Down, Depressed, Hopeless 0 0 0  PHQ - 2 Score 1 0 1  Altered sleeping 2  0  Tired, decreased energy 3  1  Change in appetite 1  0  Feeling bad or failure about yourself  0  0  Trouble concentrating 3  1  Moving slowly or fidgety/restless 0  1  Suicidal thoughts 0  0  PHQ-9 Score 10  4  Difficult doing work/chores Somewhat difficult  Not difficult at all      09/17/2021   10:06 AM 02/24/2018    2:47 PM  GAD 7 : Generalized Anxiety Score  Nervous, Anxious, on Edge 3 1  Control/stop worrying 3 0  Worry too much - different things 3 0  Trouble relaxing 2 0  Restless 1 1  Easily annoyed or irritable 3 1  Afraid - awful might happen 1 0  Total GAD 7 Score 16 3  Anxiety Difficulty Somewhat difficult Somewhat difficult   Anhedonia: no Weight changes: no Insomnia: no   Hypersomnia: no Fatigue/loss of energy: yes Feelings of worthlessness: no Feelings of guilt: no Impaired concentration/indecisiveness: no Suicidal ideations: no  Crying spells: no Recent Stressors/Life Changes: no   Relationship problems: no   Family stress: no     Financial stress: no    Job stress: no    Recent death/loss: no  Interim Problems from  his last visit: no  Depression Screen done today and results listed below:     09/17/2021   10:05 AM 02/08/2020    9:00 AM 02/24/2018    2:47 PM  Depression screen PHQ 2/9  Decreased Interest 1 0 1  Down, Depressed, Hopeless 0 0 0  PHQ - 2 Score 1 0 1  Altered sleeping 2  0  Tired, decreased energy 3  1  Change in appetite 1  0  Feeling bad or failure about yourself  0  0  Trouble concentrating 3  1  Moving slowly or fidgety/restless 0  1  Suicidal thoughts 0  0  PHQ-9 Score 10  4  Difficult doing work/chores Somewhat difficult  Not difficult at all    Past Medical History:  Past Medical History:  Diagnosis Date   Asthma    as a child    Surgical History:  History reviewed. No pertinent surgical history.  Medications:  No current outpatient medications on file prior to visit.   No current facility-administered medications on file prior to visit.    Allergies:  No Known Allergies  Social History:  Social History   Socioeconomic History   Marital status: Single    Spouse name: Not  on file   Number of children: Not on file   Years of education: Not on file   Highest education level: Not on file  Occupational History   Not on file  Tobacco Use   Smoking status: Never   Smokeless tobacco: Never  Vaping Use   Vaping Use: Never used  Substance and Sexual Activity   Alcohol use: No    Alcohol/week: 0.0 standard drinks of alcohol   Drug use: Never   Sexual activity: Yes  Other Topics Concern   Not on file  Social History Narrative   Not on file   Social Determinants of Health   Financial Resource Strain: Not on file  Food Insecurity: Not on file  Transportation Needs: Not on file  Physical Activity: Not on file  Stress: Not on file  Social Connections: Not on file  Intimate Partner Violence: Not on file   Social History   Tobacco Use  Smoking Status Never  Smokeless Tobacco Never   Social History   Substance and Sexual Activity  Alcohol Use  No   Alcohol/week: 0.0 standard drinks of alcohol    Family History:  Family History  Problem Relation Age of Onset   Hypertension Mother    Diabetes Mother    Diabetes Maternal Grandmother    Hypertension Maternal Grandmother    Hypertension Maternal Grandfather    Hypertension Paternal Grandmother    Diabetes Paternal Grandmother     Past medical history, surgical history, medications, allergies, family history and social history reviewed with patient today and changes made to appropriate areas of the chart.   Review of Systems  Constitutional: Negative.   HENT:  Positive for congestion. Negative for ear discharge, ear pain, hearing loss, nosebleeds, sinus pain, sore throat and tinnitus.   Eyes: Negative.   Respiratory: Negative.  Negative for stridor.   Cardiovascular: Negative.   Gastrointestinal: Negative.   Genitourinary: Negative.   Musculoskeletal:  Positive for myalgias. Negative for back pain, falls, joint pain and neck pain.  Skin: Negative.   Neurological: Negative.   Endo/Heme/Allergies:  Positive for environmental allergies. Negative for polydipsia. Does not bruise/bleed easily.  Psychiatric/Behavioral:  Positive for depression. Negative for hallucinations, memory loss, substance abuse and suicidal ideas. The patient is nervous/anxious. The patient does not have insomnia.    All other ROS negative except what is listed above and in the HPI.      Objective:    BP 104/67   Pulse 66   Temp (!) 97.5 F (36.4 C) (Oral)   Ht 5' 11.2" (1.808 m)   Wt 262 lb (118.8 kg)   SpO2 98%   BMI 36.34 kg/m   Wt Readings from Last 3 Encounters:  09/17/21 262 lb (118.8 kg)  02/08/20 230 lb (104.3 kg)  10/30/18 234 lb (106.1 kg)    Physical Exam Vitals and nursing note reviewed.  Constitutional:      General: He is not in acute distress.    Appearance: Normal appearance. He is obese. He is not ill-appearing, toxic-appearing or diaphoretic.  HENT:     Head:  Normocephalic and atraumatic.     Right Ear: Tympanic membrane, ear canal and external ear normal. There is no impacted cerumen.     Left Ear: Tympanic membrane, ear canal and external ear normal. There is no impacted cerumen.     Nose: Nose normal. No congestion or rhinorrhea.     Mouth/Throat:     Mouth: Mucous membranes are moist.  Pharynx: Oropharynx is clear. No oropharyngeal exudate or posterior oropharyngeal erythema.  Eyes:     General: No scleral icterus.       Right eye: No discharge.        Left eye: No discharge.     Extraocular Movements: Extraocular movements intact.     Conjunctiva/sclera: Conjunctivae normal.     Pupils: Pupils are equal, round, and reactive to light.  Neck:     Vascular: No carotid bruit.  Cardiovascular:     Rate and Rhythm: Normal rate and regular rhythm.     Pulses: Normal pulses.     Heart sounds: No murmur heard.    No friction rub. No gallop.  Pulmonary:     Effort: Pulmonary effort is normal. No respiratory distress.     Breath sounds: Normal breath sounds. No stridor. No wheezing, rhonchi or rales.  Chest:     Chest wall: No tenderness.  Abdominal:     General: Abdomen is flat. Bowel sounds are normal. There is no distension.     Palpations: Abdomen is soft. There is no mass.     Tenderness: There is no abdominal tenderness. There is no right CVA tenderness, left CVA tenderness, guarding or rebound.     Hernia: No hernia is present.  Genitourinary:    Comments: Genital exam deferred with shared decision making Musculoskeletal:        General: No swelling, tenderness, deformity or signs of injury.     Cervical back: Normal range of motion and neck supple. No rigidity. No muscular tenderness.     Right lower leg: No edema.     Left lower leg: No edema.  Lymphadenopathy:     Cervical: No cervical adenopathy.  Skin:    General: Skin is warm and dry.     Capillary Refill: Capillary refill takes less than 2 seconds.     Coloration:  Skin is not jaundiced or pale.     Findings: No bruising, erythema, lesion or rash.  Neurological:     General: No focal deficit present.     Mental Status: He is alert and oriented to person, place, and time.     Cranial Nerves: No cranial nerve deficit.     Sensory: No sensory deficit.     Motor: No weakness.     Coordination: Coordination normal.     Gait: Gait normal.     Deep Tendon Reflexes: Reflexes normal.  Psychiatric:        Mood and Affect: Mood normal.        Behavior: Behavior normal.        Thought Content: Thought content normal.        Judgment: Judgment normal.     Results for orders placed or performed during the hospital encounter of 12/24/18  HIV Antibody (routine testing w rflx)  Result Value Ref Range   HIV Screen 4th Generation wRfx Non Reactive Non Reactive  RPR  Result Value Ref Range   RPR Ser Ql Non Reactive Non Reactive  POCT urinalysis dipstick  Result Value Ref Range   Color, UA yellow yellow   Clarity, UA clear clear   Glucose, UA negative negative mg/dL   Bilirubin, UA negative negative   Ketones, POC UA negative negative mg/dL   Spec Grav, UA 5.852 7.782 - 1.025   Blood, UA trace-intact (A) negative   pH, UA 6.5 5.0 - 8.0   Protein Ur, POC negative negative mg/dL   Urobilinogen, UA 0.2 0.2 or  1.0 E.U./dL   Nitrite, UA Negative Negative   Leukocytes, UA Negative Negative  Cytology (oral, anal, urethral) ancillary only  Result Value Ref Range   Chlamydia Negative    Neisseria Gonorrhea Negative    Trichomonas Negative       Assessment & Plan:   Problem List Items Addressed This Visit       Other   Mixed obsessional thoughts and acts    Will get started on wellbutrin for mood and weight. Call with any concerns. Continue to monitor. Recheck 1 month.       Other Visit Diagnoses     Routine general medical examination at a health care facility    -  Primary   Vaccines updated. Screening labs checked today. Continue diet and  exercise. Call with any concerns.    Relevant Orders   Comprehensive metabolic panel   CBC with Differential/Platelet   Lipid Panel w/o Chol/HDL Ratio   TSH   Urinalysis, Routine w reflex microscopic   HIV Antibody (routine testing w rflx)   GC/Chlamydia Probe Amp   RPR   HSV(herpes simplex vrs) 1+2 ab-IgG   Acute Viral Hepatitis (HAV, HBV, HCV)   Need for tetanus booster       Relevant Orders   Tdap vaccine greater than or equal to 7yo IM (Completed)        LABORATORY TESTING:  Health maintenance labs ordered today as discussed above.   IMMUNIZATIONS:   - Tdap: Tetanus vaccination status reviewed: Tdap vaccination indicated and given today. - Influenza: Postponed to flu season - Pneumovax: Not applicable - Prevnar: Not applicable - COVID: Up to date - HPV: Up to date - Shingrix vaccine: Not applicable  PATIENT COUNSELING:    Sexuality: Discussed sexually transmitted diseases, partner selection, use of condoms, avoidance of unintended pregnancy  and contraceptive alternatives.   Advised to avoid cigarette smoking.  I discussed with the patient that most people either abstain from alcohol or drink within safe limits (<=14/week and <=4 drinks/occasion for males, <=7/weeks and <= 3 drinks/occasion for females) and that the risk for alcohol disorders and other health effects rises proportionally with the number of drinks per week and how often a drinker exceeds daily limits.  Discussed cessation/primary prevention of drug use and availability of treatment for abuse.   Diet: Encouraged to adjust caloric intake to maintain  or achieve ideal body weight, to reduce intake of dietary saturated fat and total fat, to limit sodium intake by avoiding high sodium foods and not adding table salt, and to maintain adequate dietary potassium and calcium preferably from fresh fruits, vegetables, and low-fat dairy products.    stressed the importance of regular exercise  Injury prevention:  Discussed safety belts, safety helmets, smoke detector, smoking near bedding or upholstery.   Dental health: Discussed importance of regular tooth brushing, flossing, and dental visits.   Follow up plan: NEXT PREVENTATIVE PHYSICAL DUE IN 1 YEAR. Return in about 4 weeks (around 10/15/2021) for follow up mood.

## 2021-09-18 LAB — CBC WITH DIFFERENTIAL/PLATELET
Basophils Absolute: 0 10*3/uL (ref 0.0–0.2)
Basos: 1 %
EOS (ABSOLUTE): 0.2 10*3/uL (ref 0.0–0.4)
Eos: 4 %
Hematocrit: 47.4 % (ref 37.5–51.0)
Hemoglobin: 15.7 g/dL (ref 13.0–17.7)
Immature Grans (Abs): 0 10*3/uL (ref 0.0–0.1)
Immature Granulocytes: 0 %
Lymphocytes Absolute: 1.6 10*3/uL (ref 0.7–3.1)
Lymphs: 32 %
MCH: 30 pg (ref 26.6–33.0)
MCHC: 33.1 g/dL (ref 31.5–35.7)
MCV: 91 fL (ref 79–97)
Monocytes Absolute: 0.5 10*3/uL (ref 0.1–0.9)
Monocytes: 10 %
Neutrophils Absolute: 2.7 10*3/uL (ref 1.4–7.0)
Neutrophils: 53 %
Platelets: 306 10*3/uL (ref 150–450)
RBC: 5.23 x10E6/uL (ref 4.14–5.80)
RDW: 13.1 % (ref 11.6–15.4)
WBC: 5 10*3/uL (ref 3.4–10.8)

## 2021-09-18 LAB — HIV ANTIBODY (ROUTINE TESTING W REFLEX): HIV Screen 4th Generation wRfx: NONREACTIVE

## 2021-09-18 LAB — LIPID PANEL W/O CHOL/HDL RATIO
Cholesterol, Total: 189 mg/dL (ref 100–199)
HDL: 43 mg/dL (ref >39)
LDL Chol Calc (NIH): 127 mg/dL — ABNORMAL HIGH (ref 0–99)
Triglycerides: 104 mg/dL (ref 0–149)
VLDL Cholesterol Cal: 19 mg/dL (ref 5–40)

## 2021-09-18 LAB — COMPREHENSIVE METABOLIC PANEL
ALT: 58 IU/L — ABNORMAL HIGH (ref 0–44)
AST: 33 IU/L (ref 0–40)
Albumin/Globulin Ratio: 1.9 (ref 1.2–2.2)
Albumin: 4.9 g/dL (ref 4.1–5.2)
Alkaline Phosphatase: 117 IU/L (ref 44–121)
BUN/Creatinine Ratio: 7 — ABNORMAL LOW (ref 9–20)
BUN: 8 mg/dL (ref 6–20)
Bilirubin Total: 0.6 mg/dL (ref 0.0–1.2)
CO2: 24 mmol/L (ref 20–29)
Calcium: 10.3 mg/dL — ABNORMAL HIGH (ref 8.7–10.2)
Chloride: 100 mmol/L (ref 96–106)
Creatinine, Ser: 1.08 mg/dL (ref 0.76–1.27)
Globulin, Total: 2.6 g/dL (ref 1.5–4.5)
Glucose: 101 mg/dL — ABNORMAL HIGH (ref 70–99)
Potassium: 4.5 mmol/L (ref 3.5–5.2)
Sodium: 140 mmol/L (ref 134–144)
Total Protein: 7.5 g/dL (ref 6.0–8.5)
eGFR: 98 mL/min/{1.73_m2} (ref >59)

## 2021-09-18 LAB — HCV INTERPRETATION

## 2021-09-18 LAB — RPR: RPR Ser Ql: NONREACTIVE

## 2021-09-18 LAB — ACUTE VIRAL HEPATITIS (HAV, HBV, HCV)
HCV Ab: NONREACTIVE
Hep A IgM: NEGATIVE
Hep B C IgM: NEGATIVE
Hepatitis B Surface Ag: NEGATIVE

## 2021-09-18 LAB — HSV(HERPES SIMPLEX VRS) I + II AB-IGG
HSV 1 Glycoprotein G Ab, IgG: <0.91 index (ref 0.00–0.90)
HSV 2 IgG, Type Spec: <0.91 index (ref 0.00–0.90)

## 2021-09-18 LAB — TSH: TSH: 1.57 u[IU]/mL (ref 0.450–4.500)

## 2021-09-20 LAB — GC/CHLAMYDIA PROBE AMP
Chlamydia trachomatis, NAA: NEGATIVE
Neisseria Gonorrhoeae by PCR: NEGATIVE

## 2021-10-15 ENCOUNTER — Telehealth: Payer: Self-pay | Admitting: Family Medicine

## 2021-10-15 ENCOUNTER — Encounter: Payer: Self-pay | Admitting: Family Medicine

## 2021-10-15 ENCOUNTER — Ambulatory Visit: Payer: BC Managed Care – PPO | Admitting: Family Medicine

## 2021-10-15 VITALS — BP 95/60 | HR 70 | Temp 98.0°F | Wt 250.7 lb

## 2021-10-15 DIAGNOSIS — F422 Mixed obsessional thoughts and acts: Secondary | ICD-10-CM

## 2021-10-15 MED ORDER — BUPROPION HCL ER (SR) 150 MG PO TB12: 300.0000 mg | ORAL_TABLET | Freq: Two times a day (BID) | ORAL | 1 refills | Status: DC

## 2021-10-15 NOTE — Progress Notes (Signed)
BP 95/60   Pulse 70   Temp 98 F (36.7 C)   Wt 250 lb 11.2 oz (113.7 kg)   SpO2 97%   BMI 34.77 kg/m    Subjective:    Patient ID: Bernard Price, male    DOB: Dec 09, 1995, 26 y.o.   MRN: [id number]  HPI: Bernard Price is a 26 y.o. male  Chief Complaint  Patient presents with   Mixed obsessional thoughts and acts    Patient states Wellbutrin is working well for him    ANXIETY/STRESS Duration chronic Status:better Anxious mood: yes  Excessive worrying: yes Irritability: yes  Sweating: no Nausea: no Palpitations:no Hyperventilation: no Panic attacks: no Agoraphobia: no  Obscessions/compulsions: yes Depressed mood: yes    10/15/2021    9:48 AM 09/17/2021   10:05 AM 02/08/2020    9:00 AM 02/24/2018    2:47 PM  Depression screen PHQ 2/9  Decreased Interest 0 1 0 1  Down, Depressed, Hopeless 0 0 0 0  PHQ - 2 Score 0 1 0 1  Altered sleeping 1 2  0  Tired, decreased energy '1 3  1  ' Change in appetite 2 1  0  Feeling bad or failure about yourself  0 0  0  Trouble concentrating '2 3  1  ' Moving slowly or fidgety/restless 1 0  1  Suicidal thoughts 0 0  0  PHQ-9 Score '7 10  4  ' Difficult doing work/chores Not difficult at all Somewhat difficult  Not difficult at all      10/15/2021    9:48 AM 09/17/2021   10:06 AM 02/24/2018    2:47 PM  GAD 7 : Generalized Anxiety Score  Nervous, Anxious, on Edge '1 3 1  ' Control/stop worrying 1 3 0  Worry too much - different things 1 3 0  Trouble relaxing 1 2 0  Restless 0 1 1  Easily annoyed or irritable '1 3 1  ' Afraid - awful might happen 0 1 0  Total GAD 7 Score '5 16 3  ' Anxiety Difficulty Not difficult at all Somewhat difficult Somewhat difficult   Anhedonia: no Weight changes: yes Insomnia: no   Hypersomnia: no Fatigue/loss of energy: yes Feelings of worthlessness: no Feelings of guilt: no Impaired concentration/indecisiveness: no Suicidal ideations: no  Crying spells: no Recent Stressors/Life Changes: no    Relationship problems: no   Family stress: no     Financial stress: no    Job stress: no    Recent death/loss: no  Relevant past medical, surgical, family and social history reviewed and updated as indicated. Interim medical history since our last visit reviewed. Allergies and medications reviewed and updated.  Review of Systems  Constitutional: Negative.   Respiratory: Negative.    Cardiovascular: Negative.   Gastrointestinal: Negative.   Psychiatric/Behavioral: Negative.      Per HPI unless specifically indicated above     Objective:    BP 95/60   Pulse 70   Temp 98 F (36.7 C)   Wt 250 lb 11.2 oz (113.7 kg)   SpO2 97%   BMI 34.77 kg/m   Wt Readings from Last 3 Encounters:  10/15/21 250 lb 11.2 oz (113.7 kg)  09/17/21 262 lb (118.8 kg)  02/08/20 230 lb (104.3 kg)    Physical Exam Vitals and nursing note reviewed.  Constitutional:      General: He is not in acute distress.    Appearance: Normal appearance. He is not ill-appearing, toxic-appearing or diaphoretic.  HENT:  Head: Normocephalic and atraumatic.     Right Ear: External ear normal.     Left Ear: External ear normal.     Nose: Nose normal.     Mouth/Throat:     Mouth: Mucous membranes are moist.     Pharynx: Oropharynx is clear.  Eyes:     General: No scleral icterus.       Right eye: No discharge.        Left eye: No discharge.     Extraocular Movements: Extraocular movements intact.     Conjunctiva/sclera: Conjunctivae normal.     Pupils: Pupils are equal, round, and reactive to light.  Cardiovascular:     Rate and Rhythm: Normal rate and regular rhythm.     Pulses: Normal pulses.     Heart sounds: Normal heart sounds. No murmur heard.    No friction rub. No gallop.  Pulmonary:     Effort: Pulmonary effort is normal. No respiratory distress.     Breath sounds: Normal breath sounds. No stridor. No wheezing, rhonchi or rales.  Chest:     Chest wall: No tenderness.  Musculoskeletal:         General: Normal range of motion.     Cervical back: Normal range of motion and neck supple.  Skin:    General: Skin is warm and dry.     Capillary Refill: Capillary refill takes less than 2 seconds.     Coloration: Skin is not jaundiced or pale.     Findings: No bruising, erythema, lesion or rash.  Neurological:     General: No focal deficit present.     Mental Status: He is alert and oriented to person, place, and time. Mental status is at baseline.  Psychiatric:        Mood and Affect: Mood normal.        Behavior: Behavior normal.        Thought Content: Thought content normal.        Judgment: Judgment normal.     Results for orders placed or performed in visit on 09/17/21  GC/Chlamydia Probe Amp   Specimen: Blood   UR  Result Value Ref Range   Chlamydia trachomatis, NAA Negative Negative   Neisseria Gonorrhoeae by PCR Negative Negative  Comprehensive metabolic panel  Result Value Ref Range   Glucose 101 (H) 70 - 99 mg/dL   BUN 8 6 - 20 mg/dL   Creatinine, Ser 1.08 0.76 - 1.27 mg/dL   eGFR 98 >59 mL/min/1.73   BUN/Creatinine Ratio 7 (L) 9 - 20   Sodium 140 134 - 144 mmol/L   Potassium 4.5 3.5 - 5.2 mmol/L   Chloride 100 96 - 106 mmol/L   CO2 24 20 - 29 mmol/L   Calcium 10.3 (H) 8.7 - 10.2 mg/dL   Total Protein 7.5 6.0 - 8.5 g/dL   Albumin 4.9 4.1 - 5.2 g/dL   Globulin, Total 2.6 1.5 - 4.5 g/dL   Albumin/Globulin Ratio 1.9 1.2 - 2.2   Bilirubin Total 0.6 0.0 - 1.2 mg/dL   Alkaline Phosphatase 117 44 - 121 IU/L   AST 33 0 - 40 IU/L   ALT 58 (H) 0 - 44 IU/L  CBC with Differential/Platelet  Result Value Ref Range   WBC 5.0 3.4 - 10.8 x10E3/uL   RBC 5.23 4.14 - 5.80 x10E6/uL   Hemoglobin 15.7 13.0 - 17.7 g/dL   Hematocrit 47.4 37.5 - 51.0 %   MCV 91 79 - 97 fL  MCH 30.0 26.6 - 33.0 pg   MCHC 33.1 31.5 - 35.7 g/dL   RDW 13.1 11.6 - 15.4 %   Platelets 306 150 - 450 x10E3/uL   Neutrophils 53 Not Estab. %   Lymphs 32 Not Estab. %   Monocytes 10 Not Estab. %    Eos 4 Not Estab. %   Basos 1 Not Estab. %   Neutrophils Absolute 2.7 1.4 - 7.0 x10E3/uL   Lymphocytes Absolute 1.6 0.7 - 3.1 x10E3/uL   Monocytes Absolute 0.5 0.1 - 0.9 x10E3/uL   EOS (ABSOLUTE) 0.2 0.0 - 0.4 x10E3/uL   Basophils Absolute 0.0 0.0 - 0.2 x10E3/uL   Immature Granulocytes 0 Not Estab. %   Immature Grans (Abs) 0.0 0.0 - 0.1 x10E3/uL  Lipid Panel w/o Chol/HDL Ratio  Result Value Ref Range   Cholesterol, Total 189 100 - 199 mg/dL   Triglycerides 104 0 - 149 mg/dL   HDL 43 >39 mg/dL   VLDL Cholesterol Cal 19 5 - 40 mg/dL   LDL Chol Calc (NIH) 127 (H) 0 - 99 mg/dL  TSH  Result Value Ref Range   TSH 1.570 0.450 - 4.500 uIU/mL  Urinalysis, Routine w reflex microscopic  Result Value Ref Range   Specific Gravity, UA 1.010 1.005 - 1.030   pH, UA 7.0 5.0 - 7.5   Color, UA Yellow Yellow   Appearance Ur Clear Clear   Leukocytes,UA Negative Negative   Protein,UA Negative Negative/Trace   Glucose, UA Negative Negative   Ketones, UA Negative Negative   RBC, UA Negative Negative   Bilirubin, UA Negative Negative   Urobilinogen, Ur 0.2 0.2 - 1.0 mg/dL   Nitrite, UA Negative Negative  HIV Antibody (routine testing w rflx)  Result Value Ref Range   HIV Screen 4th Generation wRfx Non Reactive Non Reactive  RPR  Result Value Ref Range   RPR Ser Ql Non Reactive Non Reactive  HSV(herpes simplex vrs) 1+2 ab-IgG  Result Value Ref Range   HSV 1 Glycoprotein G Ab, IgG <0.91 0.00 - 0.90 index   HSV 2 IgG, Type Spec <0.91 0.00 - 0.90 index  Acute Viral Hepatitis (HAV, HBV, HCV)  Result Value Ref Range   Hep A IgM Negative Negative   Hepatitis B Surface Ag Negative Negative   Hep B C IgM Negative Negative   HCV Ab Non Reactive Non Reactive  Interpretation:  Result Value Ref Range   HCV Interp 1: Comment       Assessment & Plan:   Problem List Items Addressed This Visit       Other   Mixed obsessional thoughts and acts - Primary    Will increase his wellbutrin to 35m and  recheck 1 month. Call with any concerns.         Follow up plan: Return in about 4 weeks (around 11/12/2021).

## 2021-10-15 NOTE — Assessment & Plan Note (Signed)
Will increase his wellbutrin to 300mg  and recheck 1 month. Call with any concerns.

## 2021-10-15 NOTE — Telephone Encounter (Signed)
Pt stated PCP increased dose for buPROPion (WELLBUTRIN SR) 150 MG 12 hr tablet 2 in the morning and 2 in the evening), but insurance doesn't want to pay. They are charging $30.00.  Pt stated PCP advised him if they charge more than $5.00 to call and let her know.  Pt is requesting a call back.   Please advise.

## 2021-10-16 NOTE — Telephone Encounter (Signed)
It looks like that is the same price on good Rx. I can change him to the XR 1 pill a day if he'd like and it'll likely be cheaper

## 2021-10-16 NOTE — Telephone Encounter (Signed)
Patient states he will pay the $30 for the Wellbutrin , no need to change the prescription.

## 2021-10-19 NOTE — Telephone Encounter (Signed)
PA initiated via CoverMyMeds for Wellbutrin 150MG   KEY: [id number] Waiting on response.

## 2021-10-22 DIAGNOSIS — M5413 Radiculopathy, cervicothoracic region: Secondary | ICD-10-CM | POA: Diagnosis not present

## 2021-10-22 DIAGNOSIS — M62838 Other muscle spasm: Secondary | ICD-10-CM | POA: Diagnosis not present

## 2021-10-22 DIAGNOSIS — M9901 Segmental and somatic dysfunction of cervical region: Secondary | ICD-10-CM | POA: Diagnosis not present

## 2021-10-22 NOTE — Telephone Encounter (Unsigned)
PA has been denied via CoverMyMeds  Will place determination paper in folder when faxed over

## 2021-10-26 DIAGNOSIS — M5413 Radiculopathy, cervicothoracic region: Secondary | ICD-10-CM | POA: Diagnosis not present

## 2021-10-26 DIAGNOSIS — M9901 Segmental and somatic dysfunction of cervical region: Secondary | ICD-10-CM | POA: Diagnosis not present

## 2021-10-26 DIAGNOSIS — M62838 Other muscle spasm: Secondary | ICD-10-CM | POA: Diagnosis not present

## 2021-10-29 DIAGNOSIS — M62838 Other muscle spasm: Secondary | ICD-10-CM | POA: Diagnosis not present

## 2021-10-29 DIAGNOSIS — M9901 Segmental and somatic dysfunction of cervical region: Secondary | ICD-10-CM | POA: Diagnosis not present

## 2021-10-29 DIAGNOSIS — M5413 Radiculopathy, cervicothoracic region: Secondary | ICD-10-CM | POA: Diagnosis not present

## 2021-11-02 DIAGNOSIS — M9901 Segmental and somatic dysfunction of cervical region: Secondary | ICD-10-CM | POA: Diagnosis not present

## 2021-11-02 DIAGNOSIS — M5413 Radiculopathy, cervicothoracic region: Secondary | ICD-10-CM | POA: Diagnosis not present

## 2021-11-02 DIAGNOSIS — M62838 Other muscle spasm: Secondary | ICD-10-CM | POA: Diagnosis not present

## 2021-11-12 ENCOUNTER — Ambulatory Visit: Payer: BC Managed Care – PPO | Admitting: Family Medicine

## 2021-11-12 ENCOUNTER — Encounter: Payer: Self-pay | Admitting: Family Medicine

## 2021-11-12 DIAGNOSIS — F422 Mixed obsessional thoughts and acts: Secondary | ICD-10-CM | POA: Diagnosis not present

## 2021-11-12 MED ORDER — BUPROPION HCL ER (XL) 300 MG PO TB24: 300.0000 mg | ORAL_TABLET | Freq: Every day | ORAL | 3 refills | Status: DC

## 2021-11-12 NOTE — Progress Notes (Signed)
BP 99/64   Pulse 69   Temp 98.1 F (36.7 C)   Wt 246 lb 1.6 oz (111.6 kg)   SpO2 97%   BMI 34.13 kg/m    Subjective:    Patient ID: Bernard Price, male    DOB: [birth year], 26 y.o.   MRN: [id number]  HPI: Bernard Price is a 26 y.o. male  Chief Complaint  Patient presents with   Mixed obsessional thoughts and acts     Patient states he has been taking 1 in the morning and 1 at night.    ANXIETY/STRESS Duration: chronic Status:stable Anxious mood: no  Excessive worrying: no Irritability: no  Sweating: no Nausea: no Palpitations:no Hyperventilation: no Panic attacks: no Agoraphobia: no  Obscessions/compulsions: no Depressed mood: no    11/12/2021    9:53 AM 10/15/2021    9:48 AM 09/17/2021   10:05 AM 02/08/2020    9:00 AM 02/24/2018    2:47 PM  Depression screen PHQ 2/9  Decreased Interest 0 0 1 0 1  Down, Depressed, Hopeless 0 0 0 0 0  PHQ - 2 Score 0 0 1 0 1  Altered sleeping 0 1 2  0  Tired, decreased energy '2 1 3  1  ' Change in appetite '1 2 1  ' 0  Feeling bad or failure about yourself  0 0 0  0  Trouble concentrating '2 2 3  1  ' Moving slowly or fidgety/restless 1 1 0  1  Suicidal thoughts 0 0 0  0  PHQ-9 Score '6 7 10  4  ' Difficult doing work/chores Somewhat difficult Not difficult at all Somewhat difficult  Not difficult at all      11/12/2021    9:54 AM 10/15/2021    9:48 AM 09/17/2021   10:06 AM 02/24/2018    2:47 PM  GAD 7 : Generalized Anxiety Score  Nervous, Anxious, on Edge '1 1 3 1  ' Control/stop worrying '1 1 3 ' 0  Worry too much - different things '1 1 3 ' 0  Trouble relaxing '1 1 2 ' 0  Restless 1 0 1 1  Easily annoyed or irritable '1 1 3 1  ' Afraid - awful might happen 0 0 1 0  Total GAD 7 Score '6 5 16 3  ' Anxiety Difficulty Somewhat difficult Not difficult at all Somewhat difficult Somewhat difficult   Anhedonia: no Weight changes: no Insomnia: no   Hypersomnia: no Fatigue/loss of energy: no Feelings of worthlessness: no Feelings of guilt:  no Impaired concentration/indecisiveness: no Suicidal ideations: no  Crying spells: no Recent Stressors/Life Changes: no   Relationship problems: no   Family stress: no     Financial stress: no    Job stress: no    Recent death/loss: no  Relevant past medical, surgical, family and social history reviewed and updated as indicated. Interim medical history since our last visit reviewed. Allergies and medications reviewed and updated.  Review of Systems  Constitutional: Negative.   Respiratory: Negative.    Cardiovascular: Negative.   Gastrointestinal: Negative.   Musculoskeletal: Negative.   Skin: Negative.   Psychiatric/Behavioral: Negative.      Per HPI unless specifically indicated above     Objective:    BP 99/64   Pulse 69   Temp 98.1 F (36.7 C)   Wt 246 lb 1.6 oz (111.6 kg)   SpO2 97%   BMI 34.13 kg/m   Wt Readings from Last 3 Encounters:  11/12/21 246 lb 1.6 oz (111.6 kg)  10/15/21  250 lb 11.2 oz (113.7 kg)  09/17/21 262 lb (118.8 kg)    Physical Exam Vitals and nursing note reviewed.  Constitutional:      General: He is not in acute distress.    Appearance: Normal appearance. He is obese. He is not ill-appearing, toxic-appearing or diaphoretic.  HENT:     Head: Normocephalic and atraumatic.     Right Ear: External ear normal.     Left Ear: External ear normal.     Nose: Nose normal.     Mouth/Throat:     Mouth: Mucous membranes are moist.     Pharynx: Oropharynx is clear.  Eyes:     General: No scleral icterus.       Right eye: No discharge.        Left eye: No discharge.     Extraocular Movements: Extraocular movements intact.     Conjunctiva/sclera: Conjunctivae normal.     Pupils: Pupils are equal, round, and reactive to light.  Cardiovascular:     Rate and Rhythm: Normal rate and regular rhythm.     Pulses: Normal pulses.     Heart sounds: Normal heart sounds. No murmur heard.    No friction rub. No gallop.  Pulmonary:     Effort:  Pulmonary effort is normal. No respiratory distress.     Breath sounds: Normal breath sounds. No stridor. No wheezing, rhonchi or rales.  Chest:     Chest wall: No tenderness.  Musculoskeletal:        General: Normal range of motion.     Cervical back: Normal range of motion and neck supple.  Skin:    General: Skin is warm and dry.     Capillary Refill: Capillary refill takes less than 2 seconds.     Coloration: Skin is not jaundiced or pale.     Findings: No bruising, erythema, lesion or rash.  Neurological:     General: No focal deficit present.     Mental Status: He is alert and oriented to person, place, and time. Mental status is at baseline.  Psychiatric:        Mood and Affect: Mood normal.        Behavior: Behavior normal.        Thought Content: Thought content normal.        Judgment: Judgment normal.     Results for orders placed or performed in visit on 09/17/21  GC/Chlamydia Probe Amp   Specimen: Blood   UR  Result Value Ref Range   Chlamydia trachomatis, NAA Negative Negative   Neisseria Gonorrhoeae by PCR Negative Negative  Comprehensive metabolic panel  Result Value Ref Range   Glucose 101 (H) 70 - 99 mg/dL   BUN 8 6 - 20 mg/dL   Creatinine, Ser 1.08 0.76 - 1.27 mg/dL   eGFR 98 >59 mL/min/1.73   BUN/Creatinine Ratio 7 (L) 9 - 20   Sodium 140 134 - 144 mmol/L   Potassium 4.5 3.5 - 5.2 mmol/L   Chloride 100 96 - 106 mmol/L   CO2 24 20 - 29 mmol/L   Calcium 10.3 (H) 8.7 - 10.2 mg/dL   Total Protein 7.5 6.0 - 8.5 g/dL   Albumin 4.9 4.1 - 5.2 g/dL   Globulin, Total 2.6 1.5 - 4.5 g/dL   Albumin/Globulin Ratio 1.9 1.2 - 2.2   Bilirubin Total 0.6 0.0 - 1.2 mg/dL   Alkaline Phosphatase 117 44 - 121 IU/L   AST 33 0 - 40 IU/L  ALT 58 (H) 0 - 44 IU/L  CBC with Differential/Platelet  Result Value Ref Range   WBC 5.0 3.4 - 10.8 x10E3/uL   RBC 5.23 4.14 - 5.80 x10E6/uL   Hemoglobin 15.7 13.0 - 17.7 g/dL   Hematocrit 47.4 37.5 - 51.0 %   MCV 91 79 - 97 fL    MCH 30.0 26.6 - 33.0 pg   MCHC 33.1 31.5 - 35.7 g/dL   RDW 13.1 11.6 - 15.4 %   Platelets 306 150 - 450 x10E3/uL   Neutrophils 53 Not Estab. %   Lymphs 32 Not Estab. %   Monocytes 10 Not Estab. %   Eos 4 Not Estab. %   Basos 1 Not Estab. %   Neutrophils Absolute 2.7 1.4 - 7.0 x10E3/uL   Lymphocytes Absolute 1.6 0.7 - 3.1 x10E3/uL   Monocytes Absolute 0.5 0.1 - 0.9 x10E3/uL   EOS (ABSOLUTE) 0.2 0.0 - 0.4 x10E3/uL   Basophils Absolute 0.0 0.0 - 0.2 x10E3/uL   Immature Granulocytes 0 Not Estab. %   Immature Grans (Abs) 0.0 0.0 - 0.1 x10E3/uL  Lipid Panel w/o Chol/HDL Ratio  Result Value Ref Range   Cholesterol, Total 189 100 - 199 mg/dL   Triglycerides 104 0 - 149 mg/dL   HDL 43 >39 mg/dL   VLDL Cholesterol Cal 19 5 - 40 mg/dL   LDL Chol Calc (NIH) 127 (H) 0 - 99 mg/dL  TSH  Result Value Ref Range   TSH 1.570 0.450 - 4.500 uIU/mL  Urinalysis, Routine w reflex microscopic  Result Value Ref Range   Specific Gravity, UA 1.010 1.005 - 1.030   pH, UA 7.0 5.0 - 7.5   Color, UA Yellow Yellow   Appearance Ur Clear Clear   Leukocytes,UA Negative Negative   Protein,UA Negative Negative/Trace   Glucose, UA Negative Negative   Ketones, UA Negative Negative   RBC, UA Negative Negative   Bilirubin, UA Negative Negative   Urobilinogen, Ur 0.2 0.2 - 1.0 mg/dL   Nitrite, UA Negative Negative  HIV Antibody (routine testing w rflx)  Result Value Ref Range   HIV Screen 4th Generation wRfx Non Reactive Non Reactive  RPR  Result Value Ref Range   RPR Ser Ql Non Reactive Non Reactive  HSV(herpes simplex vrs) 1+2 ab-IgG  Result Value Ref Range   HSV 1 Glycoprotein G Ab, IgG <0.91 0.00 - 0.90 index   HSV 2 IgG, Type Spec <0.91 0.00 - 0.90 index  Acute Viral Hepatitis (HAV, HBV, HCV)  Result Value Ref Range   Hep A IgM Negative Negative   Hepatitis B Surface Ag Negative Negative   Hep B C IgM Negative Negative   HCV Ab Non Reactive Non Reactive  Interpretation:  Result Value Ref Range    HCV Interp 1: Comment       Assessment & Plan:   Problem List Items Addressed This Visit       Other   Mixed obsessional thoughts and acts    Did not increase his wellbutrin. Will go up to 330m XR. Recheck 1 month. Call with any concerns.         Follow up plan: Return in about 4 weeks (around 12/10/2021).

## 2021-11-12 NOTE — Assessment & Plan Note (Signed)
Did not increase his wellbutrin. Will go up to 300mg  XR. Recheck 1 month. Call with any concerns.

## 2021-12-11 ENCOUNTER — Telehealth: Payer: Self-pay | Admitting: Family Medicine

## 2021-12-11 ENCOUNTER — Encounter: Payer: Self-pay | Admitting: Family Medicine

## 2021-12-11 ENCOUNTER — Ambulatory Visit: Payer: BC Managed Care – PPO | Admitting: Family Medicine

## 2021-12-11 VITALS — BP 110/63 | HR 77 | Temp 98.0°F | Wt 243.2 lb

## 2021-12-11 DIAGNOSIS — F422 Mixed obsessional thoughts and acts: Secondary | ICD-10-CM

## 2021-12-11 MED ORDER — BUPROPION HCL ER (XL) 150 MG PO TB24: 450.0000 mg | ORAL_TABLET | Freq: Every day | ORAL | 1 refills | Status: DC

## 2021-12-11 MED ORDER — BUPROPION HCL ER (XL) 450 MG PO TB24: 450.0000 mg | ORAL_TABLET | Freq: Every day | ORAL | 2 refills | Status: DC

## 2021-12-11 NOTE — Progress Notes (Signed)
BP 110/63   Pulse 77   Temp 98 F (36.7 C)   Wt 243 lb 3.2 oz (110.3 kg)   SpO2 97%   BMI 33.73 kg/m    Subjective:    Patient ID: Bernard Price, male    DOB: Feb 06, 1996, 26 y.o.   MRN: [id number]  HPI: Bernard Price is a 26 y.o. male  Chief Complaint  Patient presents with   Mixed obsessional thoughts and acts    OCD Duration: chronic Status:better Anxious mood: yes  Excessive worrying: no Irritability: no  Sweating: no Nausea: no Palpitations:no Hyperventilation: no Panic attacks: no Agoraphobia: no  Obscessions/compulsions: no Depressed mood: no    12/11/2021    9:33 AM 11/12/2021    9:53 AM 10/15/2021    9:48 AM 09/17/2021   10:05 AM 02/08/2020    9:00 AM  Depression screen PHQ 2/9  Decreased Interest 0 0 0 1 0  Down, Depressed, Hopeless 0 0 0 0 0  PHQ - 2 Score 0 0 0 1 0  Altered sleeping 0 0 1 2   Tired, decreased energy '1 2 1 3   ' Change in appetite '2 1 2 1   ' Feeling bad or failure about yourself  0 0 0 0   Trouble concentrating '1 2 2 3   ' Moving slowly or fidgety/restless 0 1 1 0   Suicidal thoughts 0 0 0 0   PHQ-9 Score '4 6 7 10   ' Difficult doing work/chores Not difficult at all Somewhat difficult Not difficult at all Somewhat difficult       12/11/2021    9:33 AM 11/12/2021    9:54 AM 10/15/2021    9:48 AM 09/17/2021   10:06 AM  GAD 7 : Generalized Anxiety Score  Nervous, Anxious, on Edge 0 '1 1 3  ' Control/stop worrying '1 1 1 3  ' Worry too much - different things '1 1 1 3  ' Trouble relaxing 0 '1 1 2  ' Restless 0 1 0 1  Easily annoyed or irritable '2 1 1 3  ' Afraid - awful might happen 0 0 0 1  Total GAD 7 Score '4 6 5 16  ' Anxiety Difficulty Not difficult at all Somewhat difficult Not difficult at all Somewhat difficult   Anhedonia: no Weight changes: no Insomnia: no   Hypersomnia: no Fatigue/loss of energy: no Feelings of worthlessness: no Feelings of guilt: no Impaired concentration/indecisiveness: no Suicidal ideations: no  Crying  spells: no Recent Stressors/Life Changes: no   Relationship problems: no   Family stress: no     Financial stress: no    Job stress: no    Recent death/loss: no  Relevant past medical, surgical, family and social history reviewed and updated as indicated. Interim medical history since our last visit reviewed. Allergies and medications reviewed and updated.  Review of Systems  Constitutional: Negative.   Respiratory: Negative.    Cardiovascular: Negative.   Gastrointestinal: Negative.   Musculoskeletal: Negative.   Skin: Negative.   Neurological: Negative.   Psychiatric/Behavioral:  Negative for agitation, behavioral problems, confusion, decreased concentration, dysphoric mood, hallucinations, self-injury, sleep disturbance and suicidal ideas. The patient is nervous/anxious. The patient is not hyperactive.     Per HPI unless specifically indicated above     Objective:    BP 110/63   Pulse 77   Temp 98 F (36.7 C)   Wt 243 lb 3.2 oz (110.3 kg)   SpO2 97%   BMI 33.73 kg/m   Wt Readings from  Last 3 Encounters:  12/11/21 243 lb 3.2 oz (110.3 kg)  11/12/21 246 lb 1.6 oz (111.6 kg)  10/15/21 250 lb 11.2 oz (113.7 kg)    Physical Exam Vitals and nursing note reviewed.  Constitutional:      General: He is not in acute distress.    Appearance: Normal appearance. He is not ill-appearing, toxic-appearing or diaphoretic.  HENT:     Head: Normocephalic and atraumatic.     Right Ear: External ear normal.     Left Ear: External ear normal.     Nose: Nose normal.     Mouth/Throat:     Mouth: Mucous membranes are moist.     Pharynx: Oropharynx is clear.  Eyes:     General: No scleral icterus.       Right eye: No discharge.        Left eye: No discharge.     Extraocular Movements: Extraocular movements intact.     Conjunctiva/sclera: Conjunctivae normal.     Pupils: Pupils are equal, round, and reactive to light.  Cardiovascular:     Rate and Rhythm: Normal rate and regular  rhythm.     Pulses: Normal pulses.     Heart sounds: Normal heart sounds. No murmur heard.    No friction rub. No gallop.  Pulmonary:     Effort: Pulmonary effort is normal. No respiratory distress.     Breath sounds: Normal breath sounds. No stridor. No wheezing, rhonchi or rales.  Chest:     Chest wall: No tenderness.  Musculoskeletal:        General: Normal range of motion.     Cervical back: Normal range of motion and neck supple.  Skin:    General: Skin is warm and dry.     Capillary Refill: Capillary refill takes less than 2 seconds.     Coloration: Skin is not jaundiced or pale.     Findings: No bruising, erythema, lesion or rash.  Neurological:     General: No focal deficit present.     Mental Status: He is alert and oriented to person, place, and time. Mental status is at baseline.  Psychiatric:        Mood and Affect: Mood normal.        Behavior: Behavior normal.        Thought Content: Thought content normal.        Judgment: Judgment normal.     Results for orders placed or performed in visit on 09/17/21  GC/Chlamydia Probe Amp   Specimen: Blood   UR  Result Value Ref Range   Chlamydia trachomatis, NAA Negative Negative   Neisseria Gonorrhoeae by PCR Negative Negative  Comprehensive metabolic panel  Result Value Ref Range   Glucose 101 (H) 70 - 99 mg/dL   BUN 8 6 - 20 mg/dL   Creatinine, Ser 1.08 0.76 - 1.27 mg/dL   eGFR 98 >59 mL/min/1.73   BUN/Creatinine Ratio 7 (L) 9 - 20   Sodium 140 134 - 144 mmol/L   Potassium 4.5 3.5 - 5.2 mmol/L   Chloride 100 96 - 106 mmol/L   CO2 24 20 - 29 mmol/L   Calcium 10.3 (H) 8.7 - 10.2 mg/dL   Total Protein 7.5 6.0 - 8.5 g/dL   Albumin 4.9 4.1 - 5.2 g/dL   Globulin, Total 2.6 1.5 - 4.5 g/dL   Albumin/Globulin Ratio 1.9 1.2 - 2.2   Bilirubin Total 0.6 0.0 - 1.2 mg/dL   Alkaline Phosphatase 117 44 -  121 IU/L   AST 33 0 - 40 IU/L   ALT 58 (H) 0 - 44 IU/L  CBC with Differential/Platelet  Result Value Ref Range   WBC  5.0 3.4 - 10.8 x10E3/uL   RBC 5.23 4.14 - 5.80 x10E6/uL   Hemoglobin 15.7 13.0 - 17.7 g/dL   Hematocrit 47.4 37.5 - 51.0 %   MCV 91 79 - 97 fL   MCH 30.0 26.6 - 33.0 pg   MCHC 33.1 31.5 - 35.7 g/dL   RDW 13.1 11.6 - 15.4 %   Platelets 306 150 - 450 x10E3/uL   Neutrophils 53 Not Estab. %   Lymphs 32 Not Estab. %   Monocytes 10 Not Estab. %   Eos 4 Not Estab. %   Basos 1 Not Estab. %   Neutrophils Absolute 2.7 1.4 - 7.0 x10E3/uL   Lymphocytes Absolute 1.6 0.7 - 3.1 x10E3/uL   Monocytes Absolute 0.5 0.1 - 0.9 x10E3/uL   EOS (ABSOLUTE) 0.2 0.0 - 0.4 x10E3/uL   Basophils Absolute 0.0 0.0 - 0.2 x10E3/uL   Immature Granulocytes 0 Not Estab. %   Immature Grans (Abs) 0.0 0.0 - 0.1 x10E3/uL  Lipid Panel w/o Chol/HDL Ratio  Result Value Ref Range   Cholesterol, Total 189 100 - 199 mg/dL   Triglycerides 104 0 - 149 mg/dL   HDL 43 >39 mg/dL   VLDL Cholesterol Cal 19 5 - 40 mg/dL   LDL Chol Calc (NIH) 127 (H) 0 - 99 mg/dL  TSH  Result Value Ref Range   TSH 1.570 0.450 - 4.500 uIU/mL  Urinalysis, Routine w reflex microscopic  Result Value Ref Range   Specific Gravity, UA 1.010 1.005 - 1.030   pH, UA 7.0 5.0 - 7.5   Color, UA Yellow Yellow   Appearance Ur Clear Clear   Leukocytes,UA Negative Negative   Protein,UA Negative Negative/Trace   Glucose, UA Negative Negative   Ketones, UA Negative Negative   RBC, UA Negative Negative   Bilirubin, UA Negative Negative   Urobilinogen, Ur 0.2 0.2 - 1.0 mg/dL   Nitrite, UA Negative Negative  HIV Antibody (routine testing w rflx)  Result Value Ref Range   HIV Screen 4th Generation wRfx Non Reactive Non Reactive  RPR  Result Value Ref Range   RPR Ser Ql Non Reactive Non Reactive  HSV(herpes simplex vrs) 1+2 ab-IgG  Result Value Ref Range   HSV 1 Glycoprotein G Ab, IgG <0.91 0.00 - 0.90 index   HSV 2 IgG, Type Spec <0.91 0.00 - 0.90 index  Acute Viral Hepatitis (HAV, HBV, HCV)  Result Value Ref Range   Hep A IgM Negative Negative    Hepatitis B Surface Ag Negative Negative   Hep B C IgM Negative Negative   HCV Ab Non Reactive Non Reactive  Interpretation:  Result Value Ref Range   HCV Interp 1: Comment       Assessment & Plan:   Problem List Items Addressed This Visit       Other   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 Will increase his wellbutrin to 442m and recheck 1 month. Call with any concerns.         Follow up plan: Return in about 4 weeks (around 01/08/2022).

## 2021-12-11 NOTE — Assessment & Plan Note (Signed)
Still not quite there. Will increase his wellbutrin to 450mg  and recheck 1 month. Call with any concerns.

## 2021-12-11 NOTE — Telephone Encounter (Signed)
Bernard Price doesn't have buPROPion 450 MG TB24 and the copay amount would $438.98 but if they do 3 tabs a day of the 150 MG / they have those in stock and the copay would be $5/ please advise if the change is ok

## 2022-01-13 ENCOUNTER — Ambulatory Visit: Payer: BC Managed Care – PPO | Admitting: Family Medicine

## 2022-01-13 ENCOUNTER — Encounter: Payer: Self-pay | Admitting: Family Medicine

## 2022-01-13 DIAGNOSIS — F422 Mixed obsessional thoughts and acts: Secondary | ICD-10-CM

## 2022-01-13 MED ORDER — BUPROPION HCL ER (XL) 150 MG PO TB24: 450.0000 mg | ORAL_TABLET | Freq: Every day | ORAL | 1 refills | Status: DC

## 2022-01-13 NOTE — Progress Notes (Signed)
BP 105/71   Pulse 69   Temp 98 F (36.7 C)   Wt 245 lb 9.6 oz (111.4 kg)   SpO2 99%   BMI 34.06 kg/m    Subjective:    Patient ID: Bernard Price, male    DOB: [birth year], 26 y.o.   MRN: [id number]  HPI: Bernard Price is a 26 y.o. male  Chief Complaint  Patient presents with   Mixed obsessional thoughts and acts    Wellbutrin increased to 472m last visit, patient here for follow up states he is tolerating dosage fine   OCD Duration:better Anxious mood: no  Excessive worrying: no Irritability: no  Sweating: no Nausea: no Palpitations:no Hyperventilation: no Panic attacks: no Agoraphobia: no  Obscessions/compulsions: no Depressed mood: no    01/13/2022    9:56 AM 12/11/2021    9:33 AM 11/12/2021    9:53 AM 10/15/2021    9:48 AM 09/17/2021   10:05 AM  Depression screen PHQ 2/9  Decreased Interest 0 0 0 0 1  Down, Depressed, Hopeless 0 0 0 0 0  PHQ - 2 Score 0 0 0 0 1  Altered sleeping 0 0 0 1 2  Tired, decreased energy '1 1 2 1 3  ' Change in appetite '2 2 1 2 1  ' Feeling bad or failure about yourself  0 0 0 0 0  Trouble concentrating '2 1 2 2 3  ' Moving slowly or fidgety/restless 0 0 1 1 0  Suicidal thoughts 0 0 0 0 0  PHQ-9 Score '5 4 6 7 10  ' Difficult doing work/chores Not difficult at all Not difficult at all Somewhat difficult Not difficult at all Somewhat difficult      01/13/2022    9:56 AM 12/11/2021    9:33 AM 11/12/2021    9:54 AM 10/15/2021    9:48 AM  GAD 7 : Generalized Anxiety Score  Nervous, Anxious, on Edge 0 0 1 1  Control/stop worrying '1 1 1 1  ' Worry too much - different things '1 1 1 1  ' Trouble relaxing 1 0 1 1  Restless 0 0 1 0  Easily annoyed or irritable '1 2 1 1  ' Afraid - awful might happen 0 0 0 0  Total GAD 7 Score '4 4 6 5  ' Anxiety Difficulty Not difficult at all Not difficult at all Somewhat difficult Not difficult at all   Anhedonia: no Weight changes: no Insomnia: no   Hypersomnia: no Fatigue/loss of energy: no Feelings of  worthlessness: no Feelings of guilt: no Impaired concentration/indecisiveness: no Suicidal ideations: no  Crying spells: no Recent Stressors/Life Changes: no   Relationship problems: no   Family stress: no     Financial stress: no    Job stress: no    Recent death/loss: no  Relevant past medical, surgical, family and social history reviewed and updated as indicated. Interim medical history since our last visit reviewed. Allergies and medications reviewed and updated.  Review of Systems  Constitutional: Negative.   Respiratory: Negative.    Cardiovascular: Negative.   Gastrointestinal: Negative.   Musculoskeletal: Negative.   Neurological: Negative.   Psychiatric/Behavioral: Negative.      Per HPI unless specifically indicated above     Objective:    BP 105/71   Pulse 69   Temp 98 F (36.7 C)   Wt 245 lb 9.6 oz (111.4 kg)   SpO2 99%   BMI 34.06 kg/m   Wt Readings from Last 3 Encounters:  01/13/22 245  lb 9.6 oz (111.4 kg)  12/11/21 243 lb 3.2 oz (110.3 kg)  11/12/21 246 lb 1.6 oz (111.6 kg)    Physical Exam Vitals and nursing note reviewed.  Constitutional:      General: He is not in acute distress.    Appearance: Normal appearance. He is obese. He is not ill-appearing, toxic-appearing or diaphoretic.  HENT:     Head: Normocephalic and atraumatic.     Right Ear: External ear normal.     Left Ear: External ear normal.     Nose: Nose normal.     Mouth/Throat:     Mouth: Mucous membranes are moist.     Pharynx: Oropharynx is clear.  Eyes:     General: No scleral icterus.       Right eye: No discharge.        Left eye: No discharge.     Extraocular Movements: Extraocular movements intact.     Conjunctiva/sclera: Conjunctivae normal.     Pupils: Pupils are equal, round, and reactive to light.  Cardiovascular:     Rate and Rhythm: Normal rate and regular rhythm.     Pulses: Normal pulses.     Heart sounds: Normal heart sounds. No murmur heard.    No friction  rub. No gallop.  Pulmonary:     Effort: Pulmonary effort is normal. No respiratory distress.     Breath sounds: Normal breath sounds. No stridor. No wheezing, rhonchi or rales.  Chest:     Chest wall: No tenderness.  Musculoskeletal:        General: Normal range of motion.     Cervical back: Normal range of motion and neck supple.  Skin:    General: Skin is warm and dry.     Capillary Refill: Capillary refill takes less than 2 seconds.     Coloration: Skin is not jaundiced or pale.     Findings: No bruising, erythema, lesion or rash.  Neurological:     General: No focal deficit present.     Mental Status: He is alert and oriented to person, place, and time. Mental status is at baseline.  Psychiatric:        Mood and Affect: Mood normal.        Behavior: Behavior normal.        Thought Content: Thought content normal.        Judgment: Judgment normal.     Results for orders placed or performed in visit on 09/17/21  GC/Chlamydia Probe Amp   Specimen: Blood   UR  Result Value Ref Range   Chlamydia trachomatis, NAA Negative Negative   Neisseria Gonorrhoeae by PCR Negative Negative  Comprehensive metabolic panel  Result Value Ref Range   Glucose 101 (H) 70 - 99 mg/dL   BUN 8 6 - 20 mg/dL   Creatinine, Ser 1.08 0.76 - 1.27 mg/dL   eGFR 98 >59 mL/min/1.73   BUN/Creatinine Ratio 7 (L) 9 - 20   Sodium 140 134 - 144 mmol/L   Potassium 4.5 3.5 - 5.2 mmol/L   Chloride 100 96 - 106 mmol/L   CO2 24 20 - 29 mmol/L   Calcium 10.3 (H) 8.7 - 10.2 mg/dL   Total Protein 7.5 6.0 - 8.5 g/dL   Albumin 4.9 4.1 - 5.2 g/dL   Globulin, Total 2.6 1.5 - 4.5 g/dL   Albumin/Globulin Ratio 1.9 1.2 - 2.2   Bilirubin Total 0.6 0.0 - 1.2 mg/dL   Alkaline Phosphatase 117 44 - 121 IU/L  AST 33 0 - 40 IU/L   ALT 58 (H) 0 - 44 IU/L  CBC with Differential/Platelet  Result Value Ref Range   WBC 5.0 3.4 - 10.8 x10E3/uL   RBC 5.23 4.14 - 5.80 x10E6/uL   Hemoglobin 15.7 13.0 - 17.7 g/dL   Hematocrit  47.4 37.5 - 51.0 %   MCV 91 79 - 97 fL   MCH 30.0 26.6 - 33.0 pg   MCHC 33.1 31.5 - 35.7 g/dL   RDW 13.1 11.6 - 15.4 %   Platelets 306 150 - 450 x10E3/uL   Neutrophils 53 Not Estab. %   Lymphs 32 Not Estab. %   Monocytes 10 Not Estab. %   Eos 4 Not Estab. %   Basos 1 Not Estab. %   Neutrophils Absolute 2.7 1.4 - 7.0 x10E3/uL   Lymphocytes Absolute 1.6 0.7 - 3.1 x10E3/uL   Monocytes Absolute 0.5 0.1 - 0.9 x10E3/uL   EOS (ABSOLUTE) 0.2 0.0 - 0.4 x10E3/uL   Basophils Absolute 0.0 0.0 - 0.2 x10E3/uL   Immature Granulocytes 0 Not Estab. %   Immature Grans (Abs) 0.0 0.0 - 0.1 x10E3/uL  Lipid Panel w/o Chol/HDL Ratio  Result Value Ref Range   Cholesterol, Total 189 100 - 199 mg/dL   Triglycerides 104 0 - 149 mg/dL   HDL 43 >39 mg/dL   VLDL Cholesterol Cal 19 5 - 40 mg/dL   LDL Chol Calc (NIH) 127 (H) 0 - 99 mg/dL  TSH  Result Value Ref Range   TSH 1.570 0.450 - 4.500 uIU/mL  Urinalysis, Routine w reflex microscopic  Result Value Ref Range   Specific Gravity, UA 1.010 1.005 - 1.030   pH, UA 7.0 5.0 - 7.5   Color, UA Yellow Yellow   Appearance Ur Clear Clear   Leukocytes,UA Negative Negative   Protein,UA Negative Negative/Trace   Glucose, UA Negative Negative   Ketones, UA Negative Negative   RBC, UA Negative Negative   Bilirubin, UA Negative Negative   Urobilinogen, Ur 0.2 0.2 - 1.0 mg/dL   Nitrite, UA Negative Negative  HIV Antibody (routine testing w rflx)  Result Value Ref Range   HIV Screen 4th Generation wRfx Non Reactive Non Reactive  RPR  Result Value Ref Range   RPR Ser Ql Non Reactive Non Reactive  HSV(herpes simplex vrs) 1+2 ab-IgG  Result Value Ref Range   HSV 1 Glycoprotein G Ab, IgG <0.91 0.00 - 0.90 index   HSV 2 IgG, Type Spec <0.91 0.00 - 0.90 index  Acute Viral Hepatitis (HAV, HBV, HCV)  Result Value Ref Range   Hep A IgM Negative Negative   Hepatitis B Surface Ag Negative Negative   Hep B C IgM Negative Negative   HCV Ab Non Reactive Non Reactive   Interpretation:  Result Value Ref Range   HCV Interp 1: Comment       Assessment & Plan:   Problem List Items Addressed This Visit       Other   Mixed obsessional thoughts and acts    Under good control on current regimen. Continue current regimen. Continue to monitor. Call with any concerns. Refills given.          Follow up plan: Return in about 6 months (around 07/15/2022) for physical.

## 2022-01-13 NOTE — Assessment & Plan Note (Signed)
Under good control on current regimen. Continue current regimen. Continue to monitor. Call with any concerns. Refills given.   

## 2022-04-27 ENCOUNTER — Encounter: Payer: Self-pay | Admitting: Emergency Medicine

## 2022-04-27 ENCOUNTER — Emergency Department: Payer: BC Managed Care – PPO

## 2022-04-27 ENCOUNTER — Emergency Department
Admission: EM | Admit: 2022-04-27 | Discharge: 2022-04-27 | Disposition: A | Discharge status: Home / Self Care | Payer: BC Managed Care – PPO | Attending: Student in an Organized Health Care Education/Training Program | Admitting: Student in an Organized Health Care Education/Training Program

## 2022-04-27 DIAGNOSIS — M4184 Other forms of scoliosis, thoracic region: Secondary | ICD-10-CM | POA: Diagnosis not present

## 2022-04-27 DIAGNOSIS — Y92410 Unspecified street and highway as the place of occurrence of the external cause: Secondary | ICD-10-CM | POA: Insufficient documentation

## 2022-04-27 DIAGNOSIS — S0990XA Unspecified injury of head, initial encounter: Secondary | ICD-10-CM | POA: Diagnosis not present

## 2022-04-27 DIAGNOSIS — Z041 Encounter for examination and observation following transport accident: Secondary | ICD-10-CM | POA: Diagnosis not present

## 2022-04-27 MED ORDER — BUTALBITAL-APAP-CAFFEINE 50-325-40 MG PO TABS: 1.0000 | ORAL_TABLET | Freq: Four times a day (QID) | ORAL | 0 refills | Status: DC | PRN
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
## 2022-04-28 ENCOUNTER — Other Ambulatory Visit: Payer: Self-pay | Admitting: Family Medicine

## 2022-04-28 ENCOUNTER — Encounter: Payer: Self-pay | Admitting: Family Medicine

## 2022-04-28 ENCOUNTER — Telehealth: Payer: Self-pay | Admitting: Family Medicine

## 2022-04-28 NOTE — Telephone Encounter (Signed)
Left message for patient to clarify which pharmacy he currently uses. Advised patient to give our office a call back to discuss.   OK for [facility] to gather clarification if patient calls back.

## 2022-04-28 NOTE — Telephone Encounter (Signed)
Should not be due for this until April

## 2022-04-28 NOTE — Telephone Encounter (Signed)
Pharmacy request new meds BUPROPION 150 MG TAB

## 2022-04-29 NOTE — Telephone Encounter (Signed)
Refused Wellbutrin XL 150 mg because it's being requested too soon.
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
## 2022-05-05 ENCOUNTER — Encounter: Payer: Self-pay | Admitting: Nurse Practitioner

## 2022-05-05 ENCOUNTER — Ambulatory Visit: Payer: Managed Care, Other (non HMO) | Admitting: Nurse Practitioner

## 2022-05-05 VITALS — BP 117/70 | HR 64 | Temp 97.7°F | Ht 71.18 in | Wt 245.5 lb

## 2022-05-05 DIAGNOSIS — M542 Cervicalgia: Secondary | ICD-10-CM

## 2022-05-05 MED ORDER — PREDNISONE 10 MG PO TABS: ORAL_TABLET | ORAL | 0 refills | Status: DC

## 2022-05-05 MED ORDER — CYCLOBENZAPRINE HCL 5 MG PO TABS: 5.0000 mg | ORAL_TABLET | Freq: Three times a day (TID) | ORAL | 1 refills | Status: DC | PRN
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
## 2022-05-05 NOTE — Assessment & Plan Note (Signed)
Refer to neck pain plan of care.
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
## 2022-05-27 ENCOUNTER — Ambulatory Visit: Payer: BC Managed Care – PPO | Admitting: Family Medicine

## 2022-06-03 ENCOUNTER — Ambulatory Visit: Payer: BC Managed Care – PPO | Admitting: Family Medicine

## 2022-06-03 ENCOUNTER — Encounter: Payer: Self-pay | Admitting: Family Medicine

## 2022-06-03 VITALS — BP 100/62 | HR 75 | Temp 97.6°F | Ht 71.0 in | Wt 244.3 lb

## 2022-06-03 DIAGNOSIS — M542 Cervicalgia: Secondary | ICD-10-CM

## 2022-06-03 DIAGNOSIS — F422 Mixed obsessional thoughts and acts: Secondary | ICD-10-CM | POA: Diagnosis not present

## 2022-06-03 MED ORDER — CYCLOBENZAPRINE HCL 5 MG PO TABS: 10.0000 mg | ORAL_TABLET | Freq: Every evening | ORAL | 1 refills | Status: DC | PRN

## 2022-06-03 MED ORDER — SERTRALINE HCL 50 MG PO TABS: ORAL_TABLET | ORAL | 3 refills | Status: DC

## 2022-06-03 NOTE — Progress Notes (Signed)
BP 100/62   Pulse 75   Temp 97.6 F (36.4 C) (Oral)   Ht '5\' 11"'$  (1.803 m)   Wt 244 lb 4.8 oz (110.8 kg)   SpO2 97%   BMI 34.07 kg/m    Subjective:    Patient ID: Bernard Price, male    DOB: 1995/08/12, 27 y.o.   MRN: [id number]  HPI: Bernard Price is a 27 y.o. male  Chief Complaint  Patient presents with   Neck Pain   NECK PAIN FOLLOW UP Status: better Treatments attempted: muscle relaxer, prednisone  Compliant with recommended treatment: yes Relief with NSAIDs?:  moderate Location:L>R Duration:weeks Severity: mild Quality: tight Frequency: intermittent Radiation: headache Aggravating factors: moving, lifting Alleviating factors: muscle relaxer Weakness:  no Paresthesias / decreased sensation:  no  Fevers:  no  ANXIETY/STRESS Duration: chronic Status:uncontrolled Anxious mood: yes  Excessive worrying: no Irritability: yes  Sweating: no Nausea: no Palpitations:no Hyperventilation: no Panic attacks: no Agoraphobia: no  Obscessions/compulsions: yes Depressed mood: no    06/03/2022    8:56 AM 01/13/2022    9:56 AM 12/11/2021    9:33 AM 11/12/2021    9:53 AM 10/15/2021    9:48 AM  Depression screen PHQ 2/9  Decreased Interest 0 0 0 0 0  Down, Depressed, Hopeless 0 0 0 0 0  PHQ - 2 Score 0 0 0 0 0  Altered sleeping 0 0 0 0 1  Tired, decreased energy '1 1 1 2 1  '$ Change in appetite 0 '2 2 1 2  '$ Feeling bad or failure about yourself  0 0 0 0 0  Trouble concentrating '3 2 1 2 2  '$ Moving slowly or fidgety/restless 0 0 0 1 1  Suicidal thoughts 0 0 0 0 0  PHQ-9 Score '4 5 4 6 7  '$ Difficult doing work/chores Not difficult at all Not difficult at all Not difficult at all Somewhat difficult Not difficult at all   Anhedonia: no Weight changes: no Insomnia: no   Hypersomnia: no Fatigue/loss of energy: no Feelings of worthlessness: no Feelings of guilt: no Impaired concentration/indecisiveness: no Suicidal ideations: no  Crying spells: no Recent  Stressors/Life Changes: no   Relationship problems: no   Family stress: no     Financial stress: no    Job stress: no    Recent death/loss: no   Relevant past medical, surgical, family and social history reviewed and updated as indicated. Interim medical history since our last visit reviewed. Allergies and medications reviewed and updated.  Review of Systems  Constitutional: Negative.   Respiratory: Negative.    Cardiovascular: Negative.   Gastrointestinal: Negative.   Musculoskeletal:  Positive for myalgias. Negative for arthralgias, back pain, gait problem, joint swelling, neck pain and neck stiffness.  Neurological: Negative.   Psychiatric/Behavioral: Negative.      Per HPI unless specifically indicated above     Objective:    BP 100/62   Pulse 75   Temp 97.6 F (36.4 C) (Oral)   Ht '5\' 11"'$  (1.803 m)   Wt 244 lb 4.8 oz (110.8 kg)   SpO2 97%   BMI 34.07 kg/m   Wt Readings from Last 3 Encounters:  06/03/22 244 lb 4.8 oz (110.8 kg)  05/05/22 245 lb 8 oz (111.4 kg)  04/27/22 245 lb (111.1 kg)    Physical Exam Vitals and nursing note reviewed.  Constitutional:      General: He is not in acute distress.    Appearance: Normal appearance. He is obese.  He is not ill-appearing, toxic-appearing or diaphoretic.  HENT:     Head: Normocephalic and atraumatic.     Right Ear: External ear normal.     Left Ear: External ear normal.     Nose: Nose normal.     Mouth/Throat:     Mouth: Mucous membranes are moist.     Pharynx: Oropharynx is clear.  Eyes:     General: No scleral icterus.       Right eye: No discharge.        Left eye: No discharge.     Extraocular Movements: Extraocular movements intact.     Conjunctiva/sclera: Conjunctivae normal.     Pupils: Pupils are equal, round, and reactive to light.  Cardiovascular:     Rate and Rhythm: Normal rate and regular rhythm.     Pulses: Normal pulses.     Heart sounds: Normal heart sounds. No murmur heard.    No friction  rub. No gallop.  Pulmonary:     Effort: Pulmonary effort is normal. No respiratory distress.     Breath sounds: Normal breath sounds. No stridor. No wheezing, rhonchi or rales.  Chest:     Chest wall: No tenderness.  Musculoskeletal:        General: Normal range of motion.     Cervical back: Normal range of motion and neck supple.     Comments: Hypertonicity paraspinals on the L  Skin:    General: Skin is warm and dry.     Capillary Refill: Capillary refill takes less than 2 seconds.     Coloration: Skin is not jaundiced or pale.     Findings: No bruising, erythema, lesion or rash.  Neurological:     General: No focal deficit present.     Mental Status: He is alert and oriented to person, place, and time. Mental status is at baseline.  Psychiatric:        Mood and Affect: Mood normal.        Behavior: Behavior normal.        Thought Content: Thought content normal.        Judgment: Judgment normal.     Results for orders placed or performed in visit on 09/17/21  GC/Chlamydia Probe Amp   Specimen: Blood   UR  Result Value Ref Range   Chlamydia trachomatis, NAA Negative Negative   Neisseria Gonorrhoeae by PCR Negative Negative  Comprehensive metabolic panel  Result Value Ref Range   Glucose 101 (H) 70 - 99 mg/dL   BUN 8 6 - 20 mg/dL   Creatinine, Ser 1.08 0.76 - 1.27 mg/dL   eGFR 98 >59 mL/min/1.73   BUN/Creatinine Ratio 7 (L) 9 - 20   Sodium 140 134 - 144 mmol/L   Potassium 4.5 3.5 - 5.2 mmol/L   Chloride 100 96 - 106 mmol/L   CO2 24 20 - 29 mmol/L   Calcium 10.3 (H) 8.7 - 10.2 mg/dL   Total Protein 7.5 6.0 - 8.5 g/dL   Albumin 4.9 4.1 - 5.2 g/dL   Globulin, Total 2.6 1.5 - 4.5 g/dL   Albumin/Globulin Ratio 1.9 1.2 - 2.2   Bilirubin Total 0.6 0.0 - 1.2 mg/dL   Alkaline Phosphatase 117 44 - 121 IU/L   AST 33 0 - 40 IU/L   ALT 58 (H) 0 - 44 IU/L  CBC with Differential/Platelet  Result Value Ref Range   WBC 5.0 3.4 - 10.8 x10E3/uL   RBC 5.23 4.14 - 5.80 x10E6/uL  Hemoglobin 15.7 13.0 - 17.7 g/dL   Hematocrit 47.4 37.5 - 51.0 %   MCV 91 79 - 97 fL   MCH 30.0 26.6 - 33.0 pg   MCHC 33.1 31.5 - 35.7 g/dL   RDW 13.1 11.6 - 15.4 %   Platelets 306 150 - 450 x10E3/uL   Neutrophils 53 Not Estab. %   Lymphs 32 Not Estab. %   Monocytes 10 Not Estab. %   Eos 4 Not Estab. %   Basos 1 Not Estab. %   Neutrophils Absolute 2.7 1.4 - 7.0 x10E3/uL   Lymphocytes Absolute 1.6 0.7 - 3.1 x10E3/uL   Monocytes Absolute 0.5 0.1 - 0.9 x10E3/uL   EOS (ABSOLUTE) 0.2 0.0 - 0.4 x10E3/uL   Basophils Absolute 0.0 0.0 - 0.2 x10E3/uL   Immature Granulocytes 0 Not Estab. %   Immature Grans (Abs) 0.0 0.0 - 0.1 x10E3/uL  Lipid Panel w/o Chol/HDL Ratio  Result Value Ref Range   Cholesterol, Total 189 100 - 199 mg/dL   Triglycerides 104 0 - 149 mg/dL   HDL 43 >39 mg/dL   VLDL Cholesterol Cal 19 5 - 40 mg/dL   LDL Chol Calc (NIH) 127 (H) 0 - 99 mg/dL  TSH  Result Value Ref Range   TSH 1.570 0.450 - 4.500 uIU/mL  Urinalysis, Routine w reflex microscopic  Result Value Ref Range   Specific Gravity, UA 1.010 1.005 - 1.030   pH, UA 7.0 5.0 - 7.5   Color, UA Yellow Yellow   Appearance Ur Clear Clear   Leukocytes,UA Negative Negative   Protein,UA Negative Negative/Trace   Glucose, UA Negative Negative   Ketones, UA Negative Negative   RBC, UA Negative Negative   Bilirubin, UA Negative Negative   Urobilinogen, Ur 0.2 0.2 - 1.0 mg/dL   Nitrite, UA Negative Negative  HIV Antibody (routine testing w rflx)  Result Value Ref Range   HIV Screen 4th Generation wRfx Non Reactive Non Reactive  RPR  Result Value Ref Range   RPR Ser Ql Non Reactive Non Reactive  HSV(herpes simplex vrs) 1+2 ab-IgG  Result Value Ref Range   HSV 1 Glycoprotein G Ab, IgG <0.91 0.00 - 0.90 index   HSV 2 IgG, Type Spec <0.91 0.00 - 0.90 index  Acute Viral Hepatitis (HAV, HBV, HCV)  Result Value Ref Range   Hep A IgM Negative Negative   Hepatitis B Surface Ag Negative Negative   Hep B C IgM Negative  Negative   HCV Ab Non Reactive Non Reactive  Interpretation:  Result Value Ref Range   HCV Interp 1: Comment       Assessment & Plan:   Problem List Items Addressed This Visit       Other   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 Call with any concerns.       Neck pain    Improved but still a little tight. Will continue flexeril PRN and start stretches. Call with any concerns.         Follow up plan: Return in about 4 weeks (around 07/01/2022).

## 2022-06-03 NOTE — Assessment & Plan Note (Signed)
Improved but still a little tight. Will continue flexeril PRN and start stretches. Call with any concerns.

## 2022-06-03 NOTE — Assessment & Plan Note (Signed)
Will start sertraline and recheck 1 month with goal of getting off wellbutrin as able. Call with any concerns.

## 2022-06-10 ENCOUNTER — Encounter: Payer: Self-pay | Admitting: Family Medicine

## 2022-06-11 MED ORDER — CETIRIZINE HCL 10 MG PO TABS: 10.0000 mg | ORAL_TABLET | Freq: Every day | ORAL | 11 refills | Status: DC

## 2022-06-16 ENCOUNTER — Telehealth: Payer: Self-pay | Admitting: Family Medicine

## 2022-06-16 NOTE — Telephone Encounter (Unsigned)
Copied from [place] [id number]. Topic: Medical Record Request - Attorney/Litigation >> Jun 16, 2022 10:23 AM Barbie Haggis wrote: Reason for CRM:  calling from [facility] office, requesting medical records and bills for patient  Please advise

## 2022-06-16 NOTE — Telephone Encounter (Signed)
Called law office and left fax number for medical records request to be sent to.

## 2022-06-16 NOTE — Telephone Encounter (Unsigned)
Copied from [place] [id number]. Topic: Medical Record Request - Attorney/Litigation >> Jun 16, 2022 10:23 AM Barbie Haggis wrote: Reason for CRM:  calling from [facility] office, requesting medical records and bills for patient  Please advise

## 2022-06-27 ENCOUNTER — Other Ambulatory Visit: Payer: Self-pay | Admitting: Family Medicine

## 2022-06-29 NOTE — Telephone Encounter (Signed)
Requested Prescriptions  Pending Prescriptions Disp Refills   buPROPion (WELLBUTRIN XL) 150 MG 24 hr tablet [Pharmacy Med Name: buPROPion HCl ER (XL) 150 MG Oral Tablet Extended Release 24 Hour] 270 tablet 0    Sig: TAKE 3 TABLETS BY MOUTH DAILY     Psychiatry: Antidepressants - bupropion Failed - 06/27/2022  8:16 AM      Failed - ALT in normal range and within 360 days    ALT  Date Value Ref Range Status  09/17/2021 58 (H) 0 - 44 IU/L Final         Passed - Cr in normal range and within 360 days    Creatinine, Ser  Date Value Ref Range Status  09/17/2021 1.08 0.76 - 1.27 mg/dL Final         Passed - AST in normal range and within 360 days    AST  Date Value Ref Range Status  09/17/2021 33 0 - 40 IU/L Final         Passed - Last BP in normal range    BP Readings from Last 1 Encounters:  06/03/22 100/62         Passed - Valid encounter within last 6 months    Recent Outpatient Visits           3 weeks ago Mixed obsessional thoughts and acts   [place] [facility] [place], Megan P, DO   1 month ago Neck pain   [place] [facility] [place], [place] T, NP   5 months ago Mixed obsessional thoughts and acts   [place] [facility] [place], Megan P, DO   6 months ago Mixed obsessional thoughts and acts   [place] [facility] [place], Megan P, DO   7 months ago Mixed obsessional thoughts and acts   [place] [facility] [place], [place], DO       Future Appointments             In 6 days Laural Benes, Oralia Rud, DO [place] [facility]   In 2 weeks Laural Benes, Oralia Rud, DO [place] [facility], [facility]

## 2022-07-05 ENCOUNTER — Ambulatory Visit: Payer: BC Managed Care – PPO | Admitting: Family Medicine

## 2022-07-05 ENCOUNTER — Encounter: Payer: Self-pay | Admitting: Family Medicine

## 2022-07-05 VITALS — BP 108/71 | HR 70 | Temp 97.6°F | Ht 71.0 in | Wt 249.0 lb

## 2022-07-05 DIAGNOSIS — F422 Mixed obsessional thoughts and acts: Secondary | ICD-10-CM | POA: Diagnosis not present

## 2022-07-05 MED ORDER — SERTRALINE HCL 100 MG PO TABS: 100.0000 mg | ORAL_TABLET | Freq: Every day | ORAL | 3 refills | Status: DC

## 2022-07-05 NOTE — Patient Instructions (Signed)
Take 2 pills of the wellbutrin a day for 2 weeks, then 1 pill a day for 2 weeks, then stop

## 2022-07-05 NOTE — Progress Notes (Signed)
BP 108/71   Pulse 70   Temp 97.6 F (36.4 C) (Oral)   Ht  (1.803 m)   Wt 249 lb (112.9 kg)   SpO2 97%   BMI 34.73 kg/m    Subjective:    Patient ID: Bernard Price, male    DOB: 1995/05/03, 27 y.o.   MRN: [id number]  HPI: Bernard Price is a 27 y.o. male  Chief Complaint  Patient presents with   Anxiety    Patient says he would like to discuss his new prescription of the Zoloft works better than Wellbutrin but would like to discuss with provider today.    OCD Duration: chronic Status:better Anxious mood: yes  Excessive worrying: yes Irritability: no  Sweating: no Nausea: no Palpitations:no Hyperventilation: no Panic attacks: no Agoraphobia: no  Obscessions/compulsions: no Depressed mood: no    07/05/2022    8:45 AM 06/03/2022    8:56 AM 01/13/2022    9:56 AM 12/11/2021    9:33 AM 11/12/2021    9:53 AM  Depression screen PHQ 2/9  Decreased Interest 0 0 0 0 0  Down, Depressed, Hopeless 0 0 0 0 0  PHQ - 2 Score 0 0 0 0 0  Altered sleeping 0 0 0 0 0  Tired, decreased energy Change in appetite 1 0 Feeling bad or failure about yourself  0 0 0 0 0  Trouble concentrating Moving slowly or fidgety/restless 0 0 0 0 1  Suicidal thoughts 0 0 0 0 0  PHQ-9 Score Difficult doing work/chores Somewhat difficult Not difficult at all Not difficult at all Not difficult at all Somewhat difficult      07/05/2022    8:45 AM 06/03/2022    8:56 AM 01/13/2022    9:56 AM 12/11/2021    9:33 AM  GAD 7 : Generalized Anxiety Score  Nervous, Anxious, on Edge 0 0 0 0  Control/stop worrying Worry too much - different things 0 Trouble relaxing 0  Restless 0 0 0 0  Easily annoyed or irritable 0 Afraid - awful might happen 0 0 0 0  Total GAD 7 Score Anxiety Difficulty Not difficult at all Not difficult at all Not difficult at all Not difficult at all   Anhedonia: no Weight changes: no Insomnia: no    Hypersomnia: no Fatigue/loss of energy: no Feelings of worthlessness: no Feelings of guilt: no Impaired concentration/indecisiveness: no Suicidal ideations: no  Crying spells: no Recent Stressors/Life Changes: no   Relationship problems: no   Family stress: no     Financial stress: no    Job stress: no    Recent death/loss: no   Relevant past medical, surgical, family and social history reviewed and updated as indicated. Interim medical history since our last visit reviewed. Allergies and medications reviewed and updated.  Review of Systems  Constitutional: Negative.   Respiratory: Negative.    Cardiovascular: Negative.   Gastrointestinal: Negative.   Musculoskeletal: Negative.   Psychiatric/Behavioral:  Positive for dysphoric mood. Negative for agitation, behavioral problems, confusion, decreased concentration, hallucinations, self-injury, sleep disturbance and suicidal ideas. The patient is nervous/anxious. The patient is not hyperactive.     Per HPI unless specifically indicated above     Objective:    BP 108/71  Pulse 70   Temp 97.6 F (36.4 C) (Oral)   Ht 5\' 11"  (1.803 m)   Wt 249 lb (112.9 kg)   SpO2 97%   BMI 34.73 kg/m   Wt Readings from Last 3 Encounters:  07/05/22 249 lb (112.9 kg)  06/03/22 244 lb 4.8 oz (110.8 kg)  05/05/22 245 lb 8 oz (111.4 kg)    Physical Exam Vitals and nursing note reviewed.  Constitutional:      General: He is not in acute distress.    Appearance: Normal appearance. He is not ill-appearing, toxic-appearing or diaphoretic.  HENT:     Head: Normocephalic and atraumatic.     Right Ear: External ear normal.     Left Ear: External ear normal.     Nose: Nose normal.     Mouth/Throat:     Mouth: Mucous membranes are moist.     Pharynx: Oropharynx is clear.  Eyes:     General: No scleral icterus.       Right eye: No discharge.        Left eye: No discharge.     Extraocular Movements: Extraocular movements intact.      Conjunctiva/sclera: Conjunctivae normal.     Pupils: Pupils are equal, round, and reactive to light.  Cardiovascular:     Rate and Rhythm: Normal rate and regular rhythm.     Pulses: Normal pulses.     Heart sounds: Normal heart sounds. No murmur heard.    No friction rub. No gallop.  Pulmonary:     Effort: Pulmonary effort is normal. No respiratory distress.     Breath sounds: Normal breath sounds. No stridor. No wheezing, rhonchi or rales.  Chest:     Chest wall: No tenderness.  Musculoskeletal:        General: Normal range of motion.     Cervical back: Normal range of motion and neck supple.  Skin:    General: Skin is warm and dry.     Capillary Refill: Capillary refill takes less than 2 seconds.     Coloration: Skin is not jaundiced or pale.     Findings: No bruising, erythema, lesion or rash.  Neurological:     General: No focal deficit present.     Mental Status: He is alert and oriented to person, place, and time. Mental status is at baseline.  Psychiatric:        Mood and Affect: Mood normal.        Behavior: Behavior normal.        Thought Content: Thought content normal.        Judgment: Judgment normal.     Results for orders placed or performed in visit on 09/17/21  GC/Chlamydia Probe Amp   Specimen: Blood   UR  Result Value Ref Range   Chlamydia trachomatis, NAA Negative Negative   Neisseria Gonorrhoeae by PCR Negative Negative  Comprehensive metabolic panel  Result Value Ref Range   Glucose 101 (H) 70 - 99 mg/dL   BUN 8 6 - 20 mg/dL   Creatinine, Ser 2.13 0.76 - 1.27 mg/dL   eGFR 98 >08 MV/HQI/6.96   BUN/Creatinine Ratio 7 (L) 9 - 20   Sodium 140 134 - 144 mmol/L   Potassium 4.5 3.5 - 5.2 mmol/L   Chloride 100 96 - 106 mmol/L   CO2 24 20 - 29 mmol/L   Calcium 10.3 (H) 8.7 - 10.2 mg/dL   Total Protein 7.5 6.0 - 8.5 g/dL   Albumin 4.9 4.1 -  5.2 g/dL   Globulin, Total 2.6 1.5 - 4.5 g/dL   Albumin/Globulin Ratio 1.9 1.2 - 2.2   Bilirubin Total 0.6 0.0 -  1.2 mg/dL   Alkaline Phosphatase 117 44 - 121 IU/L   AST 33 0 - 40 IU/L   ALT 58 (H) 0 - 44 IU/L  CBC with Differential/Platelet  Result Value Ref Range   WBC 5.0 3.4 - 10.8 x10E3/uL   RBC 5.23 4.14 - 5.80 x10E6/uL   Hemoglobin 15.7 13.0 - 17.7 g/dL   Hematocrit 16.1 09.6 - 51.0 %   MCV 91 79 - 97 fL   MCH 30.0 26.6 - 33.0 pg   MCHC 33.1 31.5 - 35.7 g/dL   RDW 04.5 40.9 - 81.1 %   Platelets 306 150 - 450 x10E3/uL   Neutrophils 53 Not Estab. %   Lymphs 32 Not Estab. %   Monocytes 10 Not Estab. %   Eos 4 Not Estab. %   Basos 1 Not Estab. %   Neutrophils Absolute 2.7 1.4 - 7.0 x10E3/uL   Lymphocytes Absolute 1.6 0.7 - 3.1 x10E3/uL   Monocytes Absolute 0.5 0.1 - 0.9 x10E3/uL   EOS (ABSOLUTE) 0.2 0.0 - 0.4 x10E3/uL   Basophils Absolute 0.0 0.0 - 0.2 x10E3/uL   Immature Granulocytes 0 Not Estab. %   Immature Grans (Abs) 0.0 0.0 - 0.1 x10E3/uL  Lipid Panel w/o Chol/HDL Ratio  Result Value Ref Range   Cholesterol, Total 189 100 - 199 mg/dL   Triglycerides 914 0 - 149 mg/dL   HDL 43 >78 mg/dL   VLDL Cholesterol Cal 19 5 - 40 mg/dL   LDL Chol Calc (NIH) 295 (H) 0 - 99 mg/dL  TSH  Result Value Ref Range   TSH 1.570 0.450 - 4.500 uIU/mL  Urinalysis, Routine w reflex microscopic  Result Value Ref Range   Specific Gravity, UA 1.010 1.005 - 1.030   pH, UA 7.0 5.0 - 7.5   Color, UA Yellow Yellow   Appearance Ur Clear Clear   Leukocytes,UA Negative Negative   Protein,UA Negative Negative/Trace   Glucose, UA Negative Negative   Ketones, UA Negative Negative   RBC, UA Negative Negative   Bilirubin, UA Negative Negative   Urobilinogen, Ur 0.2 0.2 - 1.0 mg/dL   Nitrite, UA Negative Negative  HIV Antibody (routine testing w rflx)  Result Value Ref Range   HIV Screen 4th Generation wRfx Non Reactive Non Reactive  RPR  Result Value Ref Range   RPR Ser Ql Non Reactive Non Reactive  HSV(herpes simplex vrs) 1+2 ab-IgG  Result Value Ref Range   HSV 1 Glycoprotein G Ab, IgG <0.91 0.00 -  0.90 index   HSV 2 IgG, Type Spec <0.91 0.00 - 0.90 index  Acute Viral Hepatitis (HAV, HBV, HCV)  Result Value Ref Range   Hep A IgM Negative Negative   Hepatitis B Surface Ag Negative Negative   Hep B C IgM Negative Negative   HCV Ab Non Reactive Non Reactive  Interpretation:  Result Value Ref Range   HCV Interp 1: Comment       Assessment & Plan:   Problem List Items Addressed This Visit       Other   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 Will wean off the wellbutrin and increase the sertraline. Recheck 1 month. Call with any concerns.         Follow up plan: Return in about 4 weeks (around  08/02/2022).      

## 2022-07-05 NOTE — Assessment & Plan Note (Signed)
Doing better on the sertraline. Will wean off the wellbutrin and increase the sertraline. Recheck 1 month. Call with any concerns.

## 2022-07-15 ENCOUNTER — Ambulatory Visit: Payer: BC Managed Care – PPO | Admitting: Family Medicine

## 2022-07-23 ENCOUNTER — Telehealth: Payer: Self-pay | Admitting: Family Medicine

## 2022-07-23 NOTE — Telephone Encounter (Signed)
Called  and left message for Byrd Hesselbach that paperwork was sent to the billing department.

## 2022-07-23 NOTE — Telephone Encounter (Signed)
Copied from CRM [id number]. Topic: Medical Record Request - Other >> Jul 23, 2022  9:52 AM Haroldine Laws wrote: Reason for CRM: Byrd Hesselbach from [employer] asking about the status of the medical records and bills that were supposed to be sent to their firm.  CB#  [phone]

## 2022-08-06 ENCOUNTER — Ambulatory Visit: Payer: BC Managed Care – PPO | Admitting: Family Medicine

## 2022-08-06 ENCOUNTER — Encounter: Payer: Self-pay | Admitting: Family Medicine

## 2022-08-06 VITALS — BP 109/70 | HR 71 | Temp 97.9°F | Wt 253.4 lb

## 2022-08-06 DIAGNOSIS — F422 Mixed obsessional thoughts and acts: Secondary | ICD-10-CM | POA: Diagnosis not present

## 2022-08-06 MED ORDER — SERTRALINE HCL 100 MG PO TABS: 100.0000 mg | ORAL_TABLET | Freq: Every day | ORAL | 1 refills | Status: DC

## 2022-08-06 NOTE — Progress Notes (Signed)
BP 109/70   Pulse 71   Temp 97.9 F (36.6 C) (Oral)   Wt 253 lb 6.4 oz (114.9 kg)   SpO2 97%   BMI 35.34 kg/m    Subjective:    Patient ID: Bernard Price, male    DOB: 1995-04-10, 27 y.o.   MRN: [id number]  HPI: Bernard Price is a 27 y.o. male  Chief Complaint  Patient presents with   Obsessional Thoughts   OCD Duration: chronic Status:controlled Anxious mood: no  Excessive worrying: no Irritability: no  Sweating: no Nausea: no Palpitations:no Hyperventilation: no Panic attacks: no Agoraphobia: no  Obscessions/compulsions: no Depressed mood: no    08/06/2022    2:49 PM 07/05/2022    8:45 AM 06/03/2022    8:56 AM 01/13/2022    9:56 AM 12/11/2021    9:33 AM  Depression screen PHQ 2/9  Decreased Interest 0 0 0 0 0  Down, Depressed, Hopeless 0 0 0 0 0  PHQ - 2 Score 0 0 0 0 0  Altered sleeping 1 0 0 0 0  Tired, decreased energy 1 1 1 1 1   Change in appetite 0 1 0 2 2  Feeling bad or failure about yourself  0 0 0 0 0  Trouble concentrating 0 3 3 2 1   Moving slowly or fidgety/restless 0 0 0 0 0  Suicidal thoughts 0 0 0 0 0  PHQ-9 Score 2 5 4 5 4   Difficult doing work/chores  Somewhat difficult Not difficult at all Not difficult at all Not difficult at all      08/06/2022    2:50 PM 07/05/2022    8:45 AM 06/03/2022    8:56 AM 01/13/2022    9:56 AM  GAD 7 : Generalized Anxiety Score  Nervous, Anxious, on Edge 0 0 0 0  Control/stop worrying 0 1 1 1   Worry too much - different things 0 0 1 1  Trouble relaxing 0 1 1 1   Restless 0 0 0 0  Easily annoyed or irritable 0 0 1 1  Afraid - awful might happen 0 0 0 0  Total GAD 7 Score 0 2 4 4   Anxiety Difficulty  Not difficult at all Not difficult at all Not difficult at all   Anhedonia: no Weight changes: no Insomnia: no   Hypersomnia: no Fatigue/loss of energy: no Feelings of worthlessness: no Feelings of guilt: no Impaired concentration/indecisiveness: no Suicidal ideations: no  Crying spells:  no Recent Stressors/Life Changes: no   Relationship problems: no   Family stress: no     Financial stress: no    Job stress: no    Recent death/loss: no  Relevant past medical, surgical, family and social history reviewed and updated as indicated. Interim medical history since our last visit reviewed. Allergies and medications reviewed and updated.  Review of Systems  Constitutional: Negative.   Respiratory: Negative.    Cardiovascular: Negative.   Gastrointestinal: Negative.   Musculoskeletal: Negative.   Psychiatric/Behavioral: Negative.      Per HPI unless specifically indicated above     Objective:    BP 109/70   Pulse 71   Temp 97.9 F (36.6 C) (Oral)   Wt 253 lb 6.4 oz (114.9 kg)   SpO2 97%   BMI 35.34 kg/m   Wt Readings from Last 3 Encounters:  08/06/22 253 lb 6.4 oz (114.9 kg)  07/05/22 249 lb (112.9 kg)  06/03/22 244 lb 4.8 oz (110.8 kg)    Physical Exam  Vitals and nursing note reviewed.  Constitutional:      General: He is not in acute distress.    Appearance: Normal appearance. He is well-developed.  HENT:     Head: Normocephalic and atraumatic.     Right Ear: Hearing and external ear normal.     Left Ear: Hearing and external ear normal.     Nose: Nose normal.     Mouth/Throat:     Mouth: Mucous membranes are moist.     Pharynx: Oropharynx is clear.  Eyes:     General: Lids are normal. No scleral icterus.       Right eye: No discharge.        Left eye: No discharge.     Conjunctiva/sclera: Conjunctivae normal.  Pulmonary:     Effort: Pulmonary effort is normal. No respiratory distress.  Musculoskeletal:        General: Normal range of motion.  Skin:    Coloration: Skin is not jaundiced or pale.     Findings: No bruising, erythema, lesion or rash.  Neurological:     General: No focal deficit present.     Mental Status: He is alert and oriented to person, place, and time. Mental status is at baseline.  Psychiatric:        Mood and Affect:  Mood normal.        Speech: Speech normal.        Behavior: Behavior normal.        Thought Content: Thought content normal.        Judgment: Judgment normal.     Results for orders placed or performed in visit on 09/17/21  GC/Chlamydia Probe Amp   Specimen: Blood   UR  Result Value Ref Range   Chlamydia trachomatis, NAA Negative Negative   Neisseria Gonorrhoeae by PCR Negative Negative  Comprehensive metabolic panel  Result Value Ref Range   Glucose 101 (H) 70 - 99 mg/dL   BUN 8 6 - 20 mg/dL   Creatinine, Ser 1.61 0.76 - 1.27 mg/dL   eGFR 98 >09 UE/AVW/0.98   BUN/Creatinine Ratio 7 (L) 9 - 20   Sodium 140 134 - 144 mmol/L   Potassium 4.5 3.5 - 5.2 mmol/L   Chloride 100 96 - 106 mmol/L   CO2 24 20 - 29 mmol/L   Calcium 10.3 (H) 8.7 - 10.2 mg/dL   Total Protein 7.5 6.0 - 8.5 g/dL   Albumin 4.9 4.1 - 5.2 g/dL   Globulin, Total 2.6 1.5 - 4.5 g/dL   Albumin/Globulin Ratio 1.9 1.2 - 2.2   Bilirubin Total 0.6 0.0 - 1.2 mg/dL   Alkaline Phosphatase 117 44 - 121 IU/L   AST 33 0 - 40 IU/L   ALT 58 (H) 0 - 44 IU/L  CBC with Differential/Platelet  Result Value Ref Range   WBC 5.0 3.4 - 10.8 x10E3/uL   RBC 5.23 4.14 - 5.80 x10E6/uL   Hemoglobin 15.7 13.0 - 17.7 g/dL   Hematocrit 11.9 14.7 - 51.0 %   MCV 91 79 - 97 fL   MCH 30.0 26.6 - 33.0 pg   MCHC 33.1 31.5 - 35.7 g/dL   RDW 82.9 56.2 - 13.0 %   Platelets 306 150 - 450 x10E3/uL   Neutrophils 53 Not Estab. %   Lymphs 32 Not Estab. %   Monocytes 10 Not Estab. %   Eos 4 Not Estab. %   Basos 1 Not Estab. %   Neutrophils Absolute 2.7 1.4 - 7.0 x10E3/uL  Lymphocytes Absolute 1.6 0.7 - 3.1 x10E3/uL   Monocytes Absolute 0.5 0.1 - 0.9 x10E3/uL   EOS (ABSOLUTE) 0.2 0.0 - 0.4 x10E3/uL   Basophils Absolute 0.0 0.0 - 0.2 x10E3/uL   Immature Granulocytes 0 Not Estab. %   Immature Grans (Abs) 0.0 0.0 - 0.1 x10E3/uL  Lipid Panel w/o Chol/HDL Ratio  Result Value Ref Range   Cholesterol, Total 189 100 - 199 mg/dL   Triglycerides 161  0 - 149 mg/dL   HDL 43 >09 mg/dL   VLDL Cholesterol Cal 19 5 - 40 mg/dL   LDL Chol Calc (NIH) 604 (H) 0 - 99 mg/dL  TSH  Result Value Ref Range   TSH 1.570 0.450 - 4.500 uIU/mL  Urinalysis, Routine w reflex microscopic  Result Value Ref Range   Specific Gravity, UA 1.010 1.005 - 1.030   pH, UA 7.0 5.0 - 7.5   Color, UA Yellow Yellow   Appearance Ur Clear Clear   Leukocytes,UA Negative Negative   Protein,UA Negative Negative/Trace   Glucose, UA Negative Negative   Ketones, UA Negative Negative   RBC, UA Negative Negative   Bilirubin, UA Negative Negative   Urobilinogen, Ur 0.2 0.2 - 1.0 mg/dL   Nitrite, UA Negative Negative  HIV Antibody (routine testing w rflx)  Result Value Ref Range   HIV Screen 4th Generation wRfx Non Reactive Non Reactive  RPR  Result Value Ref Range   RPR Ser Ql Non Reactive Non Reactive  HSV(herpes simplex vrs) 1+2 ab-IgG  Result Value Ref Range   HSV 1 Glycoprotein G Ab, IgG <0.91 0.00 - 0.90 index   HSV 2 IgG, Type Spec <0.91 0.00 - 0.90 index  Acute Viral Hepatitis (HAV, HBV, HCV)  Result Value Ref Range   Hep A IgM Negative Negative   Hepatitis B Surface Ag Negative Negative   Hep B C IgM Negative Negative   HCV Ab Non Reactive Non Reactive  Interpretation:  Result Value Ref Range   HCV Interp 1: Comment       Assessment & Plan:   Problem List Items Addressed This Visit       Other   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 Tolerating medicine well. Refills given. Follow up 6 months at physical.        Follow up plan: Return in about 6 months (around 02/06/2023) for physical.

## 2022-08-06 NOTE — Assessment & Plan Note (Signed)
Doing great. Tolerating medicine well. Refills given. Follow up 6 months at physical.

## 2022-08-10 ENCOUNTER — Telehealth: Payer: Self-pay | Admitting: Family Medicine

## 2022-08-10 NOTE — Telephone Encounter (Unsigned)
Copied from CRM [id number]. Topic: Medical Record Request - Attorney/Litigation >> Aug 09, 2022  4:22 PM Epimenio Foot F wrote: Reason for CRM: Pt is calling in because his [employer] Friends has tried to get medical records from his visit to the Hospital in February and his follow up visits to his PCP in March. Pt says his [employer]rit Friends says the requests have been denied due to their not being any records. Law office of [employer] in [place] is the law office. "Tammy" is the person to request for any questions. Number [phone]. They are requesting medical records and billing info.

## 2022-08-12 ENCOUNTER — Telehealth: Payer: Self-pay | Admitting: Family Medicine

## 2022-08-12 NOTE — Telephone Encounter (Signed)
Copied from CRM [id number]. Topic: Medical Record Request - Attorney/Litigation >> Aug 09, 2022  4:22 PM Bernard Price F wrote: Reason for CRM: Pt is calling in because his Bernard Price has tried to get medical records from his visit to the Hospital in February and his follow up visits to his PCP in March. Pt says his Bernard Price says the requests have been denied due to their not being any records. Law office of [employer] in [place] is the law office. "Tammy" is the person to request for any questions. Number [phone]. They are requesting medical records and billing info. >> Aug 12, 2022  2:12 PM [name] wrote: [facility] Dept and it was stated that request was sent on 06/24/2022. Called Law Firm  left message for Tammy on voice mail to inform her that the medical records and billing request was processed and sent on 06/24/2022 from [facility] HIM Department.

## 2022-08-13 NOTE — Telephone Encounter (Signed)
Spoke to [employer] and informed her that the request has been emailed again this morning to the HIM Dept. and  that could be processed when received or could take up to 30 days to process. She expressed understanding about the process.

## 2022-08-13 NOTE — Telephone Encounter (Signed)
Bernard Price from [employer] states that she did receive the voice mail where the request was sent on 06/24/22 and states that her offices does not have the records and wants to know if they can be re sent again.  Bernard Price fax: [phone] (Per Bernard Price the fax number is the same as her phone number to reach her)  Main phone number for the office: [phone]

## 2022-08-24 ENCOUNTER — Ambulatory Visit: Payer: BC Managed Care – PPO | Admitting: Family Medicine

## 2022-08-24 ENCOUNTER — Encounter: Payer: Self-pay | Admitting: Family Medicine

## 2022-08-24 VITALS — BP 110/69 | HR 80 | Temp 98.2°F | Ht 71.0 in | Wt 256.8 lb

## 2022-08-24 DIAGNOSIS — J029 Acute pharyngitis, unspecified: Secondary | ICD-10-CM

## 2022-08-24 MED ORDER — AMOXICILLIN-POT CLAVULANATE 875-125 MG PO TABS: 1.0000 | ORAL_TABLET | Freq: Two times a day (BID) | ORAL | 0 refills | Status: DC

## 2022-08-24 MED ORDER — FLUTICASONE PROPIONATE 50 MCG/ACT NA SUSP: 2.0000 | Freq: Every day | NASAL | 12 refills | Status: DC

## 2022-08-24 MED ORDER — TRIAMCINOLONE ACETONIDE 0.5 % EX OINT: 1.0000 | TOPICAL_OINTMENT | Freq: Two times a day (BID) | CUTANEOUS | 0 refills | Status: DC

## 2022-08-24 MED ORDER — PREDNISONE 50 MG PO TABS: 50.0000 mg | ORAL_TABLET | Freq: Every day | ORAL | 0 refills | Status: DC

## 2022-08-24 NOTE — Progress Notes (Signed)
BP 110/69   Pulse 80   Temp 98.2 F (36.8 C) (Oral)   Ht 5\' 11"  (1.803 m)   Wt 256 lb 12.8 oz (116.5 kg)   SpO2 96%   BMI 35.82 kg/m    Subjective:    Patient ID: Bernard Price, male    DOB: 1995/06/18, 27 y.o.   MRN: [id number]  HPI: Bernard Price is a 27 y.o. male  Chief Complaint  Patient presents with   Sore Throat    Patient says he has been symptomatic on Friday. Patient says he has tried over the counter medication and says it did not help him. Patient declines having any fever or any head cold symptoms. Patient says his symptoms started with a sore throat.    Nasal Congestion   Cough   UPPER RESPIRATORY TRACT INFECTION Duration: 4 days Worst symptom: cough, sore throat Fever: no Cough: yes Shortness of breath: no Wheezing: yes Chest pain: yes, with cough Chest tightness: yes Chest congestion: yes Nasal congestion: yes Runny nose: yes Post nasal drip: yes Sneezing: no Sore throat: yes Swollen glands: no Sinus pressure: no Headache: no Face pain: no Toothache: no Ear pain: no  Ear pressure: no  Eyes red/itching:yes Eye drainage/crusting: yes  Vomiting: no Rash: no Fatigue: no Sick contacts: yes Strep contacts: no  Context: stable Recurrent sinusitis: no Relief with OTC cold/cough medications: no  Treatments attempted: alka-seltzer   Relevant past medical, surgical, family and social history reviewed and updated as indicated. Interim medical history since our last visit reviewed. Allergies and medications reviewed and updated.  Review of Systems  Constitutional:  Positive for fatigue. Negative for activity change, appetite change, chills, diaphoresis, fever and unexpected weight change.  HENT:  Positive for congestion, postnasal drip, sinus pressure and sore throat. Negative for dental problem, drooling, ear discharge, ear pain, facial swelling, hearing loss, mouth sores, nosebleeds, rhinorrhea, sinus pain, sneezing, tinnitus, trouble  swallowing and voice change.   Eyes: Negative.   Respiratory:  Positive for cough. Negative for apnea, choking, chest tightness, shortness of breath, wheezing and stridor.   Cardiovascular: Negative.   Gastrointestinal: Negative.   Neurological: Negative.   Psychiatric/Behavioral: Negative.      Per HPI unless specifically indicated above     Objective:    BP 110/69   Pulse 80   Temp 98.2 F (36.8 C) (Oral)   Ht 5\' 11"  (1.803 m)   Wt 256 lb 12.8 oz (116.5 kg)   SpO2 96%   BMI 35.82 kg/m   Wt Readings from Last 3 Encounters:  08/24/22 256 lb 12.8 oz (116.5 kg)  08/06/22 253 lb 6.4 oz (114.9 kg)  07/05/22 249 lb (112.9 kg)    Physical Exam Vitals and nursing note reviewed.  Constitutional:      General: He is not in acute distress.    Appearance: Normal appearance. He is well-developed. He is obese. He is not ill-appearing, toxic-appearing or diaphoretic.  HENT:     Head: Normocephalic and atraumatic.     Right Ear: Tympanic membrane, ear canal and external ear normal. No drainage, swelling or tenderness. No middle ear effusion. Tympanic membrane is not erythematous.     Left Ear: Tympanic membrane, ear canal and external ear normal. No drainage, swelling or tenderness.  No middle ear effusion. Tympanic membrane is not erythematous.     Nose: Congestion and rhinorrhea present.     Mouth/Throat:     Mouth: Mucous membranes are moist. No oral lesions.  Pharynx: Pharyngeal swelling and posterior oropharyngeal erythema present. No oropharyngeal exudate or uvula swelling.     Tonsils: Tonsillar exudate present.  Eyes:     General: No scleral icterus.       Right eye: No discharge.        Left eye: No discharge.     Extraocular Movements: Extraocular movements intact.     Conjunctiva/sclera: Conjunctivae normal.     Pupils: Pupils are equal, round, and reactive to light.  Cardiovascular:     Rate and Rhythm: Normal rate and regular rhythm.     Pulses: Normal pulses.      Heart sounds: Normal heart sounds. No murmur heard.    No friction rub. No gallop.  Pulmonary:     Effort: Pulmonary effort is normal. No respiratory distress.     Breath sounds: Normal breath sounds. No stridor. No wheezing, rhonchi or rales.  Chest:     Chest wall: No tenderness.  Musculoskeletal:        General: Normal range of motion.     Cervical back: Normal range of motion and neck supple.  Skin:    General: Skin is warm and dry.     Capillary Refill: Capillary refill takes less than 2 seconds.     Coloration: Skin is not jaundiced or pale.     Findings: No bruising, erythema, lesion or rash.  Neurological:     General: No focal deficit present.     Mental Status: He is alert and oriented to person, place, and time. Mental status is at baseline.  Psychiatric:        Mood and Affect: Mood normal.        Behavior: Behavior normal.        Thought Content: Thought content normal.        Judgment: Judgment normal.     Results for orders placed or performed in visit on 09/17/21  GC/Chlamydia Probe Amp   Specimen: Blood   UR  Result Value Ref Range   Chlamydia trachomatis, NAA Negative Negative   Neisseria Gonorrhoeae by PCR Negative Negative  Comprehensive metabolic panel  Result Value Ref Range   Glucose 101 (H) 70 - 99 mg/dL   BUN 8 6 - 20 mg/dL   Creatinine, Ser 7.82 0.76 - 1.27 mg/dL   eGFR 98 >95 AO/ZHY/8.65   BUN/Creatinine Ratio 7 (L) 9 - 20   Sodium 140 134 - 144 mmol/L   Potassium 4.5 3.5 - 5.2 mmol/L   Chloride 100 96 - 106 mmol/L   CO2 24 20 - 29 mmol/L   Calcium 10.3 (H) 8.7 - 10.2 mg/dL   Total Protein 7.5 6.0 - 8.5 g/dL   Albumin 4.9 4.1 - 5.2 g/dL   Globulin, Total 2.6 1.5 - 4.5 g/dL   Albumin/Globulin Ratio 1.9 1.2 - 2.2   Bilirubin Total 0.6 0.0 - 1.2 mg/dL   Alkaline Phosphatase 117 44 - 121 IU/L   AST 33 0 - 40 IU/L   ALT 58 (H) 0 - 44 IU/L  CBC with Differential/Platelet  Result Value Ref Range   WBC 5.0 3.4 - 10.8 x10E3/uL   RBC 5.23 4.14  - 5.80 x10E6/uL   Hemoglobin 15.7 13.0 - 17.7 g/dL   Hematocrit 78.4 69.6 - 51.0 %   MCV 91 79 - 97 fL   MCH 30.0 26.6 - 33.0 pg   MCHC 33.1 31.5 - 35.7 g/dL   RDW 29.5 28.4 - 13.2 %   Platelets 306 150 -  450 x10E3/uL   Neutrophils 53 Not Estab. %   Lymphs 32 Not Estab. %   Monocytes 10 Not Estab. %   Eos 4 Not Estab. %   Basos 1 Not Estab. %   Neutrophils Absolute 2.7 1.4 - 7.0 x10E3/uL   Lymphocytes Absolute 1.6 0.7 - 3.1 x10E3/uL   Monocytes Absolute 0.5 0.1 - 0.9 x10E3/uL   EOS (ABSOLUTE) 0.2 0.0 - 0.4 x10E3/uL   Basophils Absolute 0.0 0.0 - 0.2 x10E3/uL   Immature Granulocytes 0 Not Estab. %   Immature Grans (Abs) 0.0 0.0 - 0.1 x10E3/uL  Lipid Panel w/o Chol/HDL Ratio  Result Value Ref Range   Cholesterol, Total 189 100 - 199 mg/dL   Triglycerides 295 0 - 149 mg/dL   HDL 43 >62 mg/dL   VLDL Cholesterol Cal 19 5 - 40 mg/dL   LDL Chol Calc (NIH) 130 (H) 0 - 99 mg/dL  TSH  Result Value Ref Range   TSH 1.570 0.450 - 4.500 uIU/mL  Urinalysis, Routine w reflex microscopic  Result Value Ref Range   Specific Gravity, UA 1.010 1.005 - 1.030   pH, UA 7.0 5.0 - 7.5   Color, UA Yellow Yellow   Appearance Ur Clear Clear   Leukocytes,UA Negative Negative   Protein,UA Negative Negative/Trace   Glucose, UA Negative Negative   Ketones, UA Negative Negative   RBC, UA Negative Negative   Bilirubin, UA Negative Negative   Urobilinogen, Ur 0.2 0.2 - 1.0 mg/dL   Nitrite, UA Negative Negative  HIV Antibody (routine testing w rflx)  Result Value Ref Range   HIV Screen 4th Generation wRfx Non Reactive Non Reactive  RPR  Result Value Ref Range   RPR Ser Ql Non Reactive Non Reactive  HSV(herpes simplex vrs) 1+2 ab-IgG  Result Value Ref Range   HSV 1 Glycoprotein G Ab, IgG <0.91 0.00 - 0.90 index   HSV 2 IgG, Type Spec <0.91 0.00 - 0.90 index  Acute Viral Hepatitis (HAV, HBV, HCV)  Result Value Ref Range   Hep A IgM Negative Negative   Hepatitis B Surface Ag Negative Negative   Hep  B C IgM Negative Negative   HCV Ab Non Reactive Non Reactive  Interpretation:  Result Value Ref Range   HCV Interp 1: Comment       Assessment & Plan:   Problem List Items Addressed This Visit   None Visit Diagnoses     Sore throat    -  Primary   Presumed strep. Will treat with prednisone and augmentin. Call with any concerns or if not getting better.   Relevant Orders   Rapid Strep Screen ([facility] ONLY)   Novel Coronavirus, NAA ([facility])        Follow up plan: Return if symptoms worsen or fail to improve.

## 2022-08-25 ENCOUNTER — Telehealth: Payer: Self-pay | Admitting: Family Medicine

## 2022-08-25 NOTE — Telephone Encounter (Signed)
Copied from CRM [id number]. Topic: General - Other >> Aug 25, 2022 11:35 AM Epimenio Foot F wrote: Reason for CRM: Pt says he spoke with the manager of the practice and was told his medical records and billing information from the beginning of 2024 would be sent over to the lawyer's office yesterday, but pt's lawyer is stating she hasn't received anything and this is an urgent matter. Pt says he was told from the manager at [facility] it was sent over 3 times via email and fax. Spoke with Iris who explained the process. Pt wants to know if he can come and pick up the information he's requesting in office. Please follow up with pt.

## 2022-08-26 LAB — NOVEL CORONAVIRUS, NAA: SARS-CoV-2, NAA: NOT DETECTED

## 2022-08-26 NOTE — Progress Notes (Signed)
Contacted via MyChart    Strep and Covid testing are negative:)

## 2022-08-29 LAB — CULTURE, GROUP A STREP

## 2022-08-29 LAB — RAPID STREP SCREEN (MED CTR MEBANE ONLY): Strep Gp A Ag, IA W/Reflex: NEGATIVE

## 2022-09-06 ENCOUNTER — Encounter: Payer: Self-pay | Admitting: Family Medicine

## 2022-09-10 NOTE — Telephone Encounter (Signed)
Medical records resent as requested.

## 2022-09-15 NOTE — Telephone Encounter (Signed)
Notes sent as requested.

## 2023-01-06 DIAGNOSIS — R519 Headache, unspecified: Secondary | ICD-10-CM | POA: Diagnosis not present

## 2023-01-06 DIAGNOSIS — J3089 Other allergic rhinitis: Secondary | ICD-10-CM | POA: Diagnosis not present

## 2023-01-06 DIAGNOSIS — J019 Acute sinusitis, unspecified: Secondary | ICD-10-CM | POA: Diagnosis not present

## 2023-02-07 ENCOUNTER — Ambulatory Visit (INDEPENDENT_AMBULATORY_CARE_PROVIDER_SITE_OTHER): Payer: BC Managed Care – PPO | Admitting: Family Medicine

## 2023-02-07 ENCOUNTER — Encounter: Payer: Self-pay | Admitting: Family Medicine

## 2023-02-07 VITALS — BP 101/64 | HR 61 | Temp 97.8°F | Resp 15 | Ht 72.0 in | Wt 266.6 lb

## 2023-02-07 DIAGNOSIS — Z23 Encounter for immunization: Secondary | ICD-10-CM | POA: Diagnosis not present

## 2023-02-07 DIAGNOSIS — Z0000 Encounter for general adult medical examination without abnormal findings: Secondary | ICD-10-CM

## 2023-02-07 DIAGNOSIS — F422 Mixed obsessional thoughts and acts: Secondary | ICD-10-CM | POA: Diagnosis not present

## 2023-02-07 MED ORDER — SERTRALINE HCL 100 MG PO TABS: 150.0000 mg | ORAL_TABLET | Freq: Every day | ORAL | 0 refills | Status: DC

## 2023-02-07 MED ORDER — FLUTICASONE PROPIONATE 50 MCG/ACT NA SUSP: 2.0000 | Freq: Every day | NASAL | 12 refills | Status: DC

## 2023-02-07 NOTE — Assessment & Plan Note (Signed)
Not doing great. Will increase his sertraline to 150mg  and recheck in 4-6 weeks. Call with any concerns.

## 2023-02-07 NOTE — Progress Notes (Signed)
BP 101/64 (BP Location: Left Arm, Patient Position: Sitting, Cuff Size: Large)   Pulse 61   Temp 97.8 F (36.6 C) (Oral)   Resp 15   Ht 6' (1.829 m)   Wt 266 lb 9.6 oz (120.9 kg)   SpO2 97%   BMI 36.16 kg/m    Subjective:    Patient ID: Bernard Price, male    DOB: 11/19/1995, 27 y.o.   MRN: [id number]  HPI: Bernard Price is a 27 y.o. male presenting on 02/07/2023 for comprehensive medical examination. Current medical complaints include:  OCD Duration: chronic Status:uncontrolled Anxious mood: yes  Excessive worrying: yes Irritability: yes  Sweating: no Nausea: no Palpitations:no Hyperventilation: no Panic attacks: no Agoraphobia: no  Obscessions/compulsions: yes Depressed mood: yes    08/06/2022    2:49 PM 07/05/2022    8:45 AM 06/03/2022    8:56 AM 01/13/2022    9:56 AM 12/11/2021    9:33 AM  Depression screen PHQ 2/9  Decreased Interest 0 0 0 0 0  Down, Depressed, Hopeless 0 0 0 0 0  PHQ - 2 Score 0 0 0 0 0  Altered sleeping 1 0 0 0 0  Tired, decreased energy 1 1 1 1 1   Change in appetite 0 1 0 2 2  Feeling bad or failure about yourself  0 0 0 0 0  Trouble concentrating 0 3 3 2 1   Moving slowly or fidgety/restless 0 0 0 0 0  Suicidal thoughts 0 0 0 0 0  PHQ-9 Score 2 5 4 5 4   Difficult doing work/chores  Somewhat difficult Not difficult at all Not difficult at all Not difficult at all      08/06/2022    2:50 PM 07/05/2022    8:45 AM 06/03/2022    8:56 AM 01/13/2022    9:56 AM  GAD 7 : Generalized Anxiety Score  Nervous, Anxious, on Edge 0 0 0 0  Control/stop worrying 0 1 1 1   Worry too much - different things 0 0 1 1  Trouble relaxing 0 1 1 1   Restless 0 0 0 0  Easily annoyed or irritable 0 0 1 1  Afraid - awful might happen 0 0 0 0  Total GAD 7 Score 0 2 4 4   Anxiety Difficulty  Not difficult at all Not difficult at all Not difficult at all   Anhedonia: no Weight changes: no Insomnia: no   Hypersomnia: no Fatigue/loss of energy:  yes Feelings of worthlessness: no Feelings of guilt: no Impaired concentration/indecisiveness: no Suicidal ideations: no  Crying spells: no Recent Stressors/Life Changes: no   Relationship problems: no   Family stress: no     Financial stress: no    Job stress: no    Recent death/loss: no  Interim Problems from his last visit: no  Depression Screen done today and results listed below:     08/06/2022    2:49 PM 07/05/2022    8:45 AM 06/03/2022    8:56 AM 01/13/2022    9:56 AM 12/11/2021    9:33 AM  Depression screen PHQ 2/9  Decreased Interest 0 0 0 0 0  Down, Depressed, Hopeless 0 0 0 0 0  PHQ - 2 Score 0 0 0 0 0  Altered sleeping 1 0 0 0 0  Tired, decreased energy 1 1 1 1 1   Change in appetite 0 1 0 2 2  Feeling bad or failure about yourself  0 0 0 0 0  Trouble concentrating 0 3 3 2 1   Moving slowly or fidgety/restless 0 0 0 0 0  Suicidal thoughts 0 0 0 0 0  PHQ-9 Score 2 5 4 5 4   Difficult doing work/chores  Somewhat difficult Not difficult at all Not difficult at all Not difficult at all    Past Medical History:  Past Medical History:  Diagnosis Date   Allergy    Asthma    as a child   History of ADHD     Surgical History:  History reviewed. No pertinent surgical history.  Medications:  Current Outpatient Medications on File Prior to Visit  Medication Sig   cetirizine (ZYRTEC) 10 MG tablet Take 1 tablet (10 mg total) by mouth daily.   No current facility-administered medications on file prior to visit.    Allergies:  No Known Allergies  Social History:  Social History   Socioeconomic History   Marital status: Single    Spouse name: Not on file   Number of children: Not on file   Years of education: Not on file   Highest education level: GED or equivalent  Occupational History   Occupation: Copywriter, advertising: [employer]  Tobacco Use   Smoking status: Never   Smokeless tobacco: Never  Vaping Use   Vaping status: Never Used  Substance and  Sexual Activity   Alcohol use: No    Alcohol/week: 0.0 standard drinks of alcohol   Drug use: Never   Sexual activity: Yes  Other Topics Concern   Not on file  Social History Narrative   Not on file   Social Determinants of Health   Financial Resource Strain: Low Risk  (02/07/2023)   Overall Financial Resource Strain (CARDIA)    Difficulty of Paying Living Expenses: Not hard at all  Food Insecurity: No Food Insecurity (02/07/2023)   Hunger Vital Sign    Worried About Running Out of Food in the Last Year: Never true    Ran Out of Food in the Last Year: Never true  Transportation Needs: No Transportation Needs (02/07/2023)   PRAPARE - Administrator, Civil Service (Medical): No    Lack of Transportation (Non-Medical): No  Physical Activity: Insufficiently Active (02/07/2023)   Exercise Vital Sign    Days of Exercise per Week: 2 days    Minutes of Exercise per Session: 30 min  Stress: No Stress Concern Present (02/07/2023)   [employer] of Occupational Health - Occupational Stress Questionnaire    Feeling of Stress : Not at all  Social Connections: Socially Isolated (02/07/2023)   Social Connection and Isolation Panel [NHANES]    Frequency of Communication with Friends and Family: More than three times a week    Frequency of Social Gatherings with Friends and Family: More than three times a week    Attends Religious Services: Never    Database administrator or Organizations: No    Attends Banker Meetings: Never    Marital Status: Never married  Intimate Partner Violence: Not At Risk (02/07/2023)   Humiliation, Afraid, Rape, and Kick questionnaire    Fear of Current or Ex-Partner: No    Emotionally Abused: No    Physically Abused: No    Sexually Abused: No   Social History   Tobacco Use  Smoking Status Never  Smokeless Tobacco Never   Social History   Substance and Sexual Activity  Alcohol Use No   Alcohol/week: 0.0 standard drinks of  alcohol  Family History:  Family History  Problem Relation Age of Onset   Hypertension Mother    Diabetes Mother    Diabetes Maternal Grandmother    Hypertension Maternal Grandmother    Hypertension Maternal Grandfather    Hypertension Paternal Grandmother    Diabetes Paternal Grandmother     Past medical history, surgical history, medications, allergies, family history and social history reviewed with patient today and changes made to appropriate areas of the chart.   Review of Systems  Constitutional: Negative.   HENT: Negative.    Eyes:  Positive for discharge. Negative for blurred vision, double vision, photophobia, pain and redness.  Respiratory: Negative.    Cardiovascular: Negative.   Gastrointestinal: Negative.   Genitourinary: Negative.   Musculoskeletal: Negative.   Skin: Negative.   Neurological: Negative.   Endo/Heme/Allergies:  Positive for environmental allergies. Negative for polydipsia. Does not bruise/bleed easily.  Psychiatric/Behavioral:  Negative for depression, hallucinations, memory loss, substance abuse and suicidal ideas. The patient is nervous/anxious. The patient does not have insomnia.    All other ROS negative except what is listed above and in the HPI.      Objective:    BP 101/64 (BP Location: Left Arm, Patient Position: Sitting, Cuff Size: Large)   Pulse 61   Temp 97.8 F (36.6 C) (Oral)   Resp 15   Ht 6' (1.829 m)   Wt 266 lb 9.6 oz (120.9 kg)   SpO2 97%   BMI 36.16 kg/m   Wt Readings from Last 3 Encounters:  02/07/23 266 lb 9.6 oz (120.9 kg)  08/24/22 256 lb 12.8 oz (116.5 kg)  08/06/22 253 lb 6.4 oz (114.9 kg)    Physical Exam Vitals and nursing note reviewed.  Constitutional:      General: He is not in acute distress.    Appearance: Normal appearance. He is obese. He is not ill-appearing, toxic-appearing or diaphoretic.  HENT:     Head: Normocephalic and atraumatic.     Right Ear: Tympanic membrane, ear canal and external  ear normal. There is no impacted cerumen.     Left Ear: Tympanic membrane, ear canal and external ear normal. There is no impacted cerumen.     Nose: Nose normal. No congestion or rhinorrhea.     Mouth/Throat:     Mouth: Mucous membranes are moist.     Pharynx: Oropharynx is clear. No oropharyngeal exudate or posterior oropharyngeal erythema.  Eyes:     General: No scleral icterus.       Right eye: No discharge.        Left eye: No discharge.     Extraocular Movements: Extraocular movements intact.     Conjunctiva/sclera: Conjunctivae normal.     Pupils: Pupils are equal, round, and reactive to light.  Neck:     Vascular: No carotid bruit.  Cardiovascular:     Rate and Rhythm: Normal rate and regular rhythm.     Pulses: Normal pulses.     Heart sounds: No murmur heard.    No friction rub. No gallop.  Pulmonary:     Effort: Pulmonary effort is normal. No respiratory distress.     Breath sounds: Normal breath sounds. No stridor. No wheezing, rhonchi or rales.  Chest:     Chest wall: No tenderness.  Abdominal:     General: Abdomen is flat. Bowel sounds are normal. There is no distension.     Palpations: Abdomen is soft. There is no mass.     Tenderness: There is no  abdominal tenderness. There is no right CVA tenderness, left CVA tenderness, guarding or rebound.     Hernia: No hernia is present.  Genitourinary:    Comments: Genital exam deferred with shared decision making Musculoskeletal:        General: No swelling, tenderness, deformity or signs of injury.     Cervical back: Normal range of motion and neck supple. No rigidity. No muscular tenderness.     Right lower leg: No edema.     Left lower leg: No edema.  Lymphadenopathy:     Cervical: No cervical adenopathy.  Skin:    General: Skin is warm and dry.     Capillary Refill: Capillary refill takes less than 2 seconds.     Coloration: Skin is not jaundiced or pale.     Findings: No bruising, erythema, lesion or rash.   Neurological:     General: No focal deficit present.     Mental Status: He is alert and oriented to person, place, and time.     Cranial Nerves: No cranial nerve deficit.     Sensory: No sensory deficit.     Motor: No weakness.     Coordination: Coordination normal.     Gait: Gait normal.     Deep Tendon Reflexes: Reflexes normal.  Psychiatric:        Mood and Affect: Mood normal.        Behavior: Behavior normal.        Thought Content: Thought content normal.        Judgment: Judgment normal.     Results for orders placed or performed in visit on 08/24/22  Rapid Strep Screen ([facility] ONLY)   Specimen: Other   Other  Result Value Ref Range   Strep Gp A Ag, IA W/Reflex Negative Negative  Novel Coronavirus, NAA ([facility])   Specimen: Nasopharyngeal(NP) swabs in vial transport medium  Result Value Ref Range   SARS-CoV-2, NAA Not Detected Not Detected  Culture, Group A Strep   Other  Result Value Ref Range   Strep A Culture Comment (A)       Assessment & Plan:   Problem List Items Addressed This Visit       Other   Mixed obsessional thoughts and acts    Not doing great. Will increase his sertraline to 150mg  and recheck in 4-6 weeks. Call with any concerns.       Other Visit Diagnoses     Routine general medical examination at a health care facility    -  Primary   Relevant Orders   Comprehensive metabolic panel   CBC with Differential/Platelet   Lipid Panel w/o Chol/HDL Ratio   TSH   Encounter to vaccinate patient       Relevant Orders   Flu vaccine trivalent PF, 6mos and older(Flulaval,Afluria,Fluarix,Fluzone) (Completed)       LABORATORY TESTING:  Health maintenance labs ordered today as discussed above.    IMMUNIZATIONS:   - Tdap: Tetanus vaccination status reviewed: last tetanus booster within 10 years. - Influenza: Administered today - Pneumovax: Not applicable - Prevnar: Not applicable - COVID: Up to date - HPV: Up to date   PATIENT  COUNSELING:    Sexuality: Discussed sexually transmitted diseases, partner selection, use of condoms, avoidance of unintended pregnancy  and contraceptive alternatives.   Advised to avoid cigarette smoking.  I discussed with the patient that most people either abstain from alcohol or drink within safe limits (<=14/week and <=4 drinks/occasion for males, <=  7/weeks and <= 3 drinks/occasion for females) and that the risk for alcohol disorders and other health effects rises proportionally with the number of drinks per week and how often a drinker exceeds daily limits.  Discussed cessation/primary prevention of drug use and availability of treatment for abuse.   Diet: Encouraged to adjust caloric intake to maintain  or achieve ideal body weight, to reduce intake of dietary saturated fat and total fat, to limit sodium intake by avoiding high sodium foods and not adding table salt, and to maintain adequate dietary potassium and calcium preferably from fresh fruits, vegetables, and low-fat dairy products.    stressed the importance of regular exercise  Injury prevention: Discussed safety belts, safety helmets, smoke detector, smoking near bedding or upholstery.   Dental health: Discussed importance of regular tooth brushing, flossing, and dental visits.   Follow up plan: NEXT PREVENTATIVE PHYSICAL DUE IN 1 YEAR. Return 4-6 weeks.

## 2023-02-08 LAB — CBC WITH DIFFERENTIAL/PLATELET
Basophils Absolute: 0 10*3/uL (ref 0.0–0.2)
Basos: 1 %
EOS (ABSOLUTE): 0.2 10*3/uL (ref 0.0–0.4)
Eos: 3 %
Hematocrit: 48.2 % (ref 37.5–51.0)
Hemoglobin: 16.2 g/dL (ref 13.0–17.7)
Immature Grans (Abs): 0 10*3/uL (ref 0.0–0.1)
Immature Granulocytes: 0 %
Lymphocytes Absolute: 2.1 10*3/uL (ref 0.7–3.1)
Lymphs: 34 %
MCH: 30.1 pg (ref 26.6–33.0)
MCHC: 33.6 g/dL (ref 31.5–35.7)
MCV: 90 fL (ref 79–97)
Monocytes Absolute: 0.6 10*3/uL (ref 0.1–0.9)
Monocytes: 11 %
Neutrophils Absolute: 3.1 10*3/uL (ref 1.4–7.0)
Neutrophils: 51 %
Platelets: 356 10*3/uL (ref 150–450)
RBC: 5.38 x10E6/uL (ref 4.14–5.80)
RDW: 13.2 % (ref 11.6–15.4)
WBC: 6 10*3/uL (ref 3.4–10.8)

## 2023-02-08 LAB — COMPREHENSIVE METABOLIC PANEL
ALT: 43 [IU]/L (ref 0–44)
AST: 28 [IU]/L (ref 0–40)
Albumin: 4.8 g/dL (ref 4.3–5.2)
Alkaline Phosphatase: 133 [IU]/L — ABNORMAL HIGH (ref 44–121)
BUN/Creatinine Ratio: 9 (ref 9–20)
BUN: 10 mg/dL (ref 6–20)
Bilirubin Total: 0.5 mg/dL (ref 0.0–1.2)
CO2: 24 mmol/L (ref 20–29)
Calcium: 10.4 mg/dL — ABNORMAL HIGH (ref 8.7–10.2)
Chloride: 100 mmol/L (ref 96–106)
Creatinine, Ser: 1.14 mg/dL (ref 0.76–1.27)
Globulin, Total: 2.9 g/dL (ref 1.5–4.5)
Glucose: 82 mg/dL (ref 70–99)
Potassium: 4.4 mmol/L (ref 3.5–5.2)
Sodium: 139 mmol/L (ref 134–144)
Total Protein: 7.7 g/dL (ref 6.0–8.5)
eGFR: 92 mL/min/{1.73_m2} (ref >59)

## 2023-02-08 LAB — LIPID PANEL W/O CHOL/HDL RATIO
Cholesterol, Total: 219 mg/dL — ABNORMAL HIGH (ref 100–199)
HDL: 44 mg/dL (ref >39)
LDL Chol Calc (NIH): 152 mg/dL — ABNORMAL HIGH (ref 0–99)
Triglycerides: 128 mg/dL (ref 0–149)
VLDL Cholesterol Cal: 23 mg/dL (ref 5–40)

## 2023-02-08 LAB — TSH: TSH: 2.52 u[IU]/mL (ref 0.450–4.500)

## 2023-03-18 ENCOUNTER — Ambulatory Visit: Payer: BC Managed Care – PPO | Admitting: Family Medicine

## 2023-04-04 ENCOUNTER — Encounter: Payer: Self-pay | Admitting: Family Medicine

## 2023-04-11 ENCOUNTER — Ambulatory Visit: Payer: Managed Care, Other (non HMO) | Admitting: Family Medicine

## 2023-04-11 ENCOUNTER — Encounter: Payer: Self-pay | Admitting: Family Medicine

## 2023-04-11 VITALS — BP 107/64 | HR 73 | Temp 97.9°F | Wt 272.8 lb

## 2023-04-11 DIAGNOSIS — E6609 Other obesity due to excess calories: Secondary | ICD-10-CM

## 2023-04-11 DIAGNOSIS — Z6837 Body mass index (BMI) 37.0-37.9, adult: Secondary | ICD-10-CM

## 2023-04-11 DIAGNOSIS — E66812 Obesity, class 2: Secondary | ICD-10-CM | POA: Insufficient documentation

## 2023-04-11 LAB — BAYER DCA HB A1C WAIVED: HB A1C (BAYER DCA - WAIVED): 5.2 % (ref 4.8–5.6)

## 2023-04-11 MED ORDER — WEGOVY 0.25 MG/0.5ML ~~LOC~~ SOAJ: 0.2500 mg | SUBCUTANEOUS | 0 refills | Status: DC

## 2023-04-11 MED ORDER — WEGOVY 0.5 MG/0.5ML ~~LOC~~ SOAJ: 0.5000 mg | SUBCUTANEOUS | 0 refills | Status: DC

## 2023-04-11 NOTE — Progress Notes (Signed)
BP 107/64   Pulse 73   Temp 97.9 F (36.6 C) (Oral)   Wt 272 lb 12.8 oz (123.7 kg)   SpO2 96%   BMI 37.00 kg/m    Subjective:    Patient ID: Bernard Price, male    DOB: 04-23-1995, 28 y.o.   MRN: [id number]  HPI: Bernard Price is a 29 y.o. male  Chief Complaint  Patient presents with   Weight Loss    Patient says he wants to discuss Weight Loss options with provider.    OBESITY Duration: chronic Previous attempts at weight loss: yes, diet, exercise, joined weight watchers Complications of obesity: none Peak weight: 272 lbs (current) Weight loss goal: 200 Weight loss to date: none Requesting obesity pharmacotherapy: yes Current weight loss supplements/medications: no Previous weight loss supplements/meds: no  Clinical coverage for weight loss GLP's   Medication being dispensed is Wegovy 3 mL/28 day. Titration doses are 2 mL/28 days.   [x]  Product being prescribed is FDA approved for the indication, age, weight (if applicable) and not does not exceed dosing limits per the Prescribing Information per the clinical conditions for use.  [x]  Patient's baseline weight measured within the last 45 days as required by provider before dispensing.  [x]  Patient is new to therapy and One of the following:  [x]  The beneficiary is 26 years of age or over and has ONE of the following:  [x]  A BMI greater than or equal to 30 kg/m2  []  A BMI greater than or equal to 27 kg/m2 with at least one weight-related comorbidity/risk factor/complication (i.e. hypertension, type 2 diabetes, obstructive sleep apnea, cardiovascular disease, dyslipidemia)  []   If patient has one weight-related comorbidity/risk factor/complication (i.e. hypertension, type 2 diabetes, obstructive sleep apnea, cardiovascular disease, dyslipidemia), please list  Patient suffers from weight-related comorbidity/risk factor/complication none  [x]  The beneficiary is currently on and will continue lifestyle  modification including structured nutrition and physical activity, unless physical activity is not clinically appropriate at the time GLP1 therapy commences AND  [x]  The beneficiary will NOT be using the requested agent in combination with another GLP-1 receptor agonist agent AND  [x]  The beneficiary does NOT have any FDA-labeled contraindications to the requested agent, including pregnancy, lactation, history of medullary thyroid cancer or multiple endocrine neoplasia type II.   Estimated body mass index is 37 kg/m as calculated from the following:   Height as of 02/07/23: 6' (1.829 m).   Weight as of this encounter: 272 lb 12.8 oz (123.7 kg).      Relevant past medical, surgical, family and social history reviewed and updated as indicated. Interim medical history since our last visit reviewed. Allergies and medications reviewed and updated.  Review of Systems  Constitutional: Negative.   Respiratory: Negative.    Cardiovascular: Negative.   Musculoskeletal: Negative.   Psychiatric/Behavioral: Negative.      Per HPI unless specifically indicated above     Objective:    BP 107/64   Pulse 73   Temp 97.9 F (36.6 C) (Oral)   Wt 272 lb 12.8 oz (123.7 kg)   SpO2 96%   BMI 37.00 kg/m   Wt Readings from Last 3 Encounters:  04/11/23 272 lb 12.8 oz (123.7 kg)  02/07/23 266 lb 9.6 oz (120.9 kg)  08/24/22 256 lb 12.8 oz (116.5 kg)    Physical Exam Vitals and nursing note reviewed.  Constitutional:      General: He is not in acute distress.    Appearance: Normal appearance.  He is obese. He is not ill-appearing, toxic-appearing or diaphoretic.  HENT:     Head: Normocephalic and atraumatic.     Right Ear: External ear normal.     Left Ear: External ear normal.     Nose: Nose normal.     Mouth/Throat:     Mouth: Mucous membranes are moist.     Pharynx: Oropharynx is clear.  Eyes:     General: No scleral icterus.       Right eye: No discharge.        Left eye: No  discharge.     Extraocular Movements: Extraocular movements intact.     Conjunctiva/sclera: Conjunctivae normal.     Pupils: Pupils are equal, round, and reactive to light.  Cardiovascular:     Rate and Rhythm: Normal rate and regular rhythm.     Pulses: Normal pulses.     Heart sounds: Normal heart sounds. No murmur heard.    No friction rub. No gallop.  Pulmonary:     Effort: Pulmonary effort is normal. No respiratory distress.     Breath sounds: Normal breath sounds. No stridor. No wheezing, rhonchi or rales.  Chest:     Chest wall: No tenderness.  Musculoskeletal:        General: Normal range of motion.     Cervical back: Normal range of motion and neck supple.  Skin:    General: Skin is warm and dry.     Capillary Refill: Capillary refill takes less than 2 seconds.     Coloration: Skin is not jaundiced or pale.     Findings: No bruising, erythema, lesion or rash.  Neurological:     General: No focal deficit present.     Mental Status: He is alert and oriented to person, place, and time. Mental status is at baseline.  Psychiatric:        Mood and Affect: Mood normal.        Behavior: Behavior normal.        Thought Content: Thought content normal.        Judgment: Judgment normal.     Results for orders placed or performed in visit on 02/07/23  Comprehensive metabolic panel   Collection Time: 02/07/23  2:46 PM  Result Value Ref Range   Glucose 82 70 - 99 mg/dL   BUN 10 6 - 20 mg/dL   Creatinine, Ser 6.57 0.76 - 1.27 mg/dL   eGFR 92 >84 ON/GEX/5.28   BUN/Creatinine Ratio 9 9 - 20   Sodium 139 134 - 144 mmol/L   Potassium 4.4 3.5 - 5.2 mmol/L   Chloride 100 96 - 106 mmol/L   CO2 24 20 - 29 mmol/L   Calcium 10.4 (H) 8.7 - 10.2 mg/dL   Total Protein 7.7 6.0 - 8.5 g/dL   Albumin 4.8 4.3 - 5.2 g/dL   Globulin, Total 2.9 1.5 - 4.5 g/dL   Bilirubin Total 0.5 0.0 - 1.2 mg/dL   Alkaline Phosphatase 133 (H) 44 - 121 IU/L   AST 28 0 - 40 IU/L   ALT 43 0 - 44 IU/L  CBC  with Differential/Platelet   Collection Time: 02/07/23  2:46 PM  Result Value Ref Range   WBC 6.0 3.4 - 10.8 x10E3/uL   RBC 5.38 4.14 - 5.80 x10E6/uL   Hemoglobin 16.2 13.0 - 17.7 g/dL   Hematocrit 41.3 24.4 - 51.0 %   MCV 90 79 - 97 fL   MCH 30.1 26.6 - 33.0 pg  MCHC 33.6 31.5 - 35.7 g/dL   RDW 19.1 47.8 - 29.5 %   Platelets 356 150 - 450 x10E3/uL   Neutrophils 51 Not Estab. %   Lymphs 34 Not Estab. %   Monocytes 11 Not Estab. %   Eos 3 Not Estab. %   Basos 1 Not Estab. %   Neutrophils Absolute 3.1 1.4 - 7.0 x10E3/uL   Lymphocytes Absolute 2.1 0.7 - 3.1 x10E3/uL   Monocytes Absolute 0.6 0.1 - 0.9 x10E3/uL   EOS (ABSOLUTE) 0.2 0.0 - 0.4 x10E3/uL   Basophils Absolute 0.0 0.0 - 0.2 x10E3/uL   Immature Granulocytes 0 Not Estab. %   Immature Grans (Abs) 0.0 0.0 - 0.1 x10E3/uL  Lipid Panel w/o Chol/HDL Ratio   Collection Time: 02/07/23  2:46 PM  Result Value Ref Range   Cholesterol, Total 219 (H) 100 - 199 mg/dL   Triglycerides 621 0 - 149 mg/dL   HDL 44 >30 mg/dL   VLDL Cholesterol Cal 23 5 - 40 mg/dL   LDL Chol Calc (NIH) 865 (H) 0 - 99 mg/dL  TSH   Collection Time: 02/07/23  2:46 PM  Result Value Ref Range   TSH 2.520 0.450 - 4.500 uIU/mL      Assessment & Plan:   Problem List Items Addressed This Visit       Other   Class 2 obesity due to excess calories without serious comorbidity with body mass index (BMI) of 37.0 to 37.9 in adult - Primary   Will check A1c. Start wegovy- if not covered consider naltrexone. Call with any concerns. Recheck 6 weeks.       Relevant Medications   Semaglutide-Weight Management (WEGOVY) 0.25 MG/0.5ML SOAJ   Semaglutide-Weight Management (WEGOVY) 0.5 MG/0.5ML SOAJ (Start on 05/12/2023)   Other Relevant Orders   Bayer DCA Hb A1c Waived     Follow up plan: Return in about 6 weeks (around 05/23/2023).

## 2023-04-11 NOTE — Assessment & Plan Note (Signed)
Will check A1c. Start wegovy- if not covered consider naltrexone. Call with any concerns. Recheck 6 weeks.

## 2023-04-12 ENCOUNTER — Encounter: Payer: Self-pay | Admitting: Family Medicine

## 2023-04-17 MED ORDER — NALTREXONE HCL 50 MG PO TABS: 25.0000 mg | ORAL_TABLET | Freq: Every day | ORAL | 2 refills | Status: DC

## 2023-04-19 ENCOUNTER — Ambulatory Visit: Payer: Managed Care, Other (non HMO) | Admitting: Family Medicine

## 2023-05-02 ENCOUNTER — Telehealth: Payer: Self-pay | Admitting: Family Medicine

## 2023-05-02 ENCOUNTER — Encounter: Payer: Self-pay | Admitting: Family Medicine

## 2023-05-02 NOTE — Telephone Encounter (Signed)
 Copied from CRM [id number]. Topic: General - Other >> May 02, 2023  1:13 PM Everette C wrote: Reason for CRM: The patient has called to request review of their prior authorization with [employer] for Wegovy    The patient shares that they have no history of past use of weight loss injections  The patient would like to have their prior authorization resubmitted/reviewed if/when possible

## 2023-05-02 NOTE — Telephone Encounter (Signed)
 Responded to patients mychart message as we need his rx benefits information.

## 2023-05-10 ENCOUNTER — Other Ambulatory Visit: Payer: Self-pay | Admitting: Family Medicine

## 2023-05-10 ENCOUNTER — Encounter: Payer: Self-pay | Admitting: Family Medicine

## 2023-05-10 NOTE — Telephone Encounter (Signed)
 Please get him scheduled for 6 week follow up

## 2023-05-12 NOTE — Telephone Encounter (Signed)
 Appointment has been made left message on machine

## 2023-05-26 ENCOUNTER — Other Ambulatory Visit: Payer: Self-pay | Admitting: Family Medicine

## 2023-05-26 ENCOUNTER — Ambulatory Visit: Payer: Self-pay | Admitting: Family Medicine

## 2023-05-30 ENCOUNTER — Other Ambulatory Visit: Payer: Self-pay | Admitting: Family Medicine

## 2023-05-30 MED ORDER — WEGOVY 0.5 MG/0.5ML ~~LOC~~ SOAJ: 0.5000 mg | SUBCUTANEOUS | 1 refills | Status: DC

## 2023-06-07 ENCOUNTER — Other Ambulatory Visit: Payer: Self-pay | Admitting: Family Medicine

## 2023-06-08 NOTE — Telephone Encounter (Signed)
 Requested Prescriptions  Pending Prescriptions Disp Refills   sertraline (ZOLOFT) 100 MG tablet [Pharmacy Med Name: SERTRALINE HYDROCHLORIDE 100MG  TABLET] 135 tablet 0    Sig: TAKE ONE AND A HALF (1 & 1/2) TABLETS (150 MG TOTAL) BY MOUTH DAILY.     Psychiatry:  Antidepressants - SSRI - sertraline Passed - 06/08/2023  3:01 PM      Passed - AST in normal range and within 360 days    AST  Date Value Ref Range Status  02/07/2023 28 0 - 40 IU/L Final         Passed - ALT in normal range and within 360 days    ALT  Date Value Ref Range Status  02/07/2023 43 0 - 44 IU/L Final         Passed - Completed PHQ-2 or PHQ-9 in the last 360 days      Passed - Valid encounter within last 6 months    Recent Outpatient Visits           1 month ago Class 2 obesity due to excess calories without serious comorbidity with body mass index (BMI) of 37.0 to 37.9 in adult   [facility] [place], Megan P, DO   4 months ago Routine general medical examination at a health care facility   [facility] [place] P, DO   9 months ago Sore throat   [facility] [place], Megan P, DO   10 months ago Mixed obsessional thoughts and acts   [place] [facility] [place], Megan P, DO   11 months ago Mixed obsessional thoughts and acts   [place] [facility] [place], [place], DO       Future Appointments             In 3 weeks Laural Benes, Oralia Rud, DO  [facility]

## 2023-06-24 ENCOUNTER — Encounter: Payer: Self-pay | Admitting: Family Medicine

## 2023-07-04 ENCOUNTER — Ambulatory Visit: Payer: No Typology Code available for payment source | Admitting: Family Medicine

## 2023-08-01 ENCOUNTER — Encounter: Payer: Self-pay | Admitting: Family Medicine

## 2023-08-02 ENCOUNTER — Encounter: Payer: Self-pay | Admitting: Family Medicine

## 2023-08-02 ENCOUNTER — Ambulatory Visit: Admitting: Family Medicine

## 2023-08-02 VITALS — BP 107/71 | HR 87 | Ht 71.0 in | Wt 274.4 lb

## 2023-08-02 DIAGNOSIS — E66812 Obesity, class 2: Secondary | ICD-10-CM | POA: Diagnosis not present

## 2023-08-02 DIAGNOSIS — E6609 Other obesity due to excess calories: Secondary | ICD-10-CM

## 2023-08-02 DIAGNOSIS — Z6837 Body mass index (BMI) 37.0-37.9, adult: Secondary | ICD-10-CM

## 2023-08-02 MED ORDER — WEGOVY 1.7 MG/0.75ML ~~LOC~~ SOAJ: 1.7000 mg | SUBCUTANEOUS | 1 refills | Status: DC

## 2023-08-02 MED ORDER — WEGOVY 1 MG/0.5ML ~~LOC~~ SOAJ: 1.0000 mg | SUBCUTANEOUS | 1 refills | Status: DC

## 2023-08-02 NOTE — Progress Notes (Signed)
 BP 107/71 (BP Location: Left Arm, Patient Position: Sitting, Cuff Size: Large)   Pulse 87   Ht 5\' 11"  (1.803 m)   Wt 274 lb 6.4 oz (124.5 kg)   SpO2 98%   BMI 38.27 kg/m    Subjective:    Patient ID: Bernard Price, male    DOB: 10/21/1995, 28 y.o.   MRN: [id number]  HPI: Bernard Price is a 28 y.o. male  Chief Complaint  Patient presents with   Obesity    Pt would like to increase Wegovy  dosage to 1mg .    OBESITY Duration: chronic Previous attempts at weight loss: yes, diet, exercise, joined weight watchers Complications of obesity: none Peak weight: 272 lbs (current) Weight loss goal: 200 Weight loss to date: -2lbs Requesting obesity pharmacotherapy: yes Current weight loss supplements/medications: yes Previous weight loss supplements/meds: no  Relevant past medical, surgical, family and social history reviewed and updated as indicated. Interim medical history since our last visit reviewed. Allergies and medications reviewed and updated.  Review of Systems  Constitutional: Negative.   Respiratory: Negative.    Cardiovascular: Negative.   Musculoskeletal: Negative.   Psychiatric/Behavioral: Negative.      Per HPI unless specifically indicated above     Objective:     BP 107/71 (BP Location: Left Arm, Patient Position: Sitting, Cuff Size: Large)   Pulse 87   Ht 5\' 11"  (1.803 m)   Wt 274 lb 6.4 oz (124.5 kg)   SpO2 98%   BMI 38.27 kg/m   Wt Readings from Last 3 Encounters:  08/02/23 274 lb 6.4 oz (124.5 kg)  04/11/23 272 lb 12.8 oz (123.7 kg)  02/07/23 266 lb 9.6 oz (120.9 kg)    Physical Exam Vitals and nursing note reviewed.  Constitutional:      General: He is not in acute distress.    Appearance: Normal appearance. He is obese. He is not ill-appearing, toxic-appearing or diaphoretic.  HENT:     Head: Normocephalic and atraumatic.     Right Ear: External ear normal.     Left Ear: External ear normal.     Nose: Nose normal.     Mouth/Throat:      Mouth: Mucous membranes are moist.     Pharynx: Oropharynx is clear.  Eyes:     General: No scleral icterus.       Right eye: No discharge.        Left eye: No discharge.     Extraocular Movements: Extraocular movements intact.     Conjunctiva/sclera: Conjunctivae normal.     Pupils: Pupils are equal, round, and reactive to light.  Cardiovascular:     Rate and Rhythm: Normal rate and regular rhythm.     Pulses: Normal pulses.     Heart sounds: Normal heart sounds. No murmur heard.    No friction rub. No gallop.  Pulmonary:     Effort: Pulmonary effort is normal. No respiratory distress.     Breath sounds: Normal breath sounds. No stridor. No wheezing, rhonchi or rales.  Chest:     Chest wall: No tenderness.  Musculoskeletal:        General: Normal range of motion.     Cervical back: Normal range of motion and neck supple.  Skin:    General: Skin is warm and dry.     Capillary Refill: Capillary refill takes less than 2 seconds.     Coloration: Skin is not jaundiced or pale.     Findings: No bruising,  erythema, lesion or rash.  Neurological:     General: No focal deficit present.     Mental Status: He is alert and oriented to person, place, and time. Mental status is at baseline.  Psychiatric:        Mood and Affect: Mood normal.        Behavior: Behavior normal.        Thought Content: Thought content normal.        Judgment: Judgment normal.     Results for orders placed or performed in visit on 04/11/23  Bayer DCA Hb A1c Waived   Collection Time: 04/11/23  3:17 PM  Result Value Ref Range   HB A1C (BAYER DCA - WAIVED) 5.2 4.8 - 5.6 %      Assessment & Plan:   Problem List Items Addressed This Visit       Other   Class 2 obesity due to excess calories without serious comorbidity with body mass index (BMI) of 37.0 to 37.9 in adult - Primary   Will increase his wegovy  to 1mg  with the plan to increase to 1.7 in 1 month. Follow up 6 weeks. Call with any concerns.        Relevant Medications   Semaglutide -Weight Management (WEGOVY ) 1 MG/0.5ML SOAJ   Semaglutide -Weight Management (WEGOVY ) 1.7 MG/0.75ML SOAJ (Start on 08/30/2023)     Follow up plan: Return in about 6 weeks (around 09/13/2023).

## 2023-08-02 NOTE — Assessment & Plan Note (Signed)
 Will increase his wegovy  to 1mg  with the plan to increase to 1.7 in 1 month. Follow up 6 weeks. Call with any concerns.

## 2023-08-18 ENCOUNTER — Ambulatory Visit: Admitting: Family Medicine

## 2023-08-29 ENCOUNTER — Ambulatory Visit: Admitting: Family Medicine

## 2023-09-09 ENCOUNTER — Other Ambulatory Visit: Payer: Self-pay | Admitting: Family Medicine

## 2023-09-12 NOTE — Telephone Encounter (Signed)
 Requested Prescriptions  Pending Prescriptions Disp Refills   sertraline  (ZOLOFT ) 100 MG tablet [Pharmacy Med Name: SERTRALINE  100MG  TABLETS] 135 tablet 0    Sig: TAKE 1 1/2 TABLETS BY MOUTH EVERY DAY     Psychiatry:  Antidepressants - SSRI - sertraline  Failed - 09/12/2023  3:23 PM      Failed - Valid encounter within last 6 months    Recent Outpatient Visits           1 month ago Class 2 obesity due to excess calories without serious comorbidity with body mass index (BMI) of 37.0 to 37.9 in adult   [place] [facility], Megan P, DO              Passed - AST in normal range and within 360 days    AST  Date Value Ref Range Status  02/07/2023 28 0 - 40 IU/L Final         Passed - ALT in normal range and within 360 days    ALT  Date Value Ref Range Status  02/07/2023 43 0 - 44 IU/L Final         Passed - Completed PHQ-2 or PHQ-9 in the last 360 days

## 2023-09-13 ENCOUNTER — Encounter: Payer: Self-pay | Admitting: Family Medicine

## 2023-09-13 ENCOUNTER — Ambulatory Visit: Admitting: Family Medicine

## 2023-09-13 VITALS — BP 124/63 | HR 92 | Temp 98.5°F | Resp 17 | Ht 70.98 in | Wt 267.6 lb

## 2023-09-13 DIAGNOSIS — E6609 Other obesity due to excess calories: Secondary | ICD-10-CM | POA: Diagnosis not present

## 2023-09-13 DIAGNOSIS — E66812 Obesity, class 2: Secondary | ICD-10-CM | POA: Diagnosis not present

## 2023-09-13 DIAGNOSIS — F422 Mixed obsessional thoughts and acts: Secondary | ICD-10-CM | POA: Diagnosis not present

## 2023-09-13 DIAGNOSIS — Z6837 Body mass index (BMI) 37.0-37.9, adult: Secondary | ICD-10-CM

## 2023-09-13 MED ORDER — SERTRALINE HCL 100 MG PO TABS: 150.0000 mg | ORAL_TABLET | Freq: Every day | ORAL | 1 refills | Status: DC

## 2023-09-13 MED ORDER — WEGOVY 2.4 MG/0.75ML ~~LOC~~ SOAJ: 2.4000 mg | SUBCUTANEOUS | 1 refills | Status: DC

## 2023-09-13 NOTE — Progress Notes (Signed)
 BP 124/63 (BP Location: Left Arm, Patient Position: Sitting, Cuff Size: Large)   Pulse 92   Temp 98.5 F (36.9 C) (Oral)   Resp 17   Ht 5' 10.98 (1.803 m)   Wt 267 lb 9.6 oz (121.4 kg)   SpO2 98%   BMI 37.34 kg/m    Subjective:    Patient ID: Bernard Price, male    DOB: Jul 14, 1995, 28 y.o.   MRN: [id number]  HPI: Bernard Price is a 28 y.o. male  Chief Complaint  Patient presents with   Obesity   OBESITY- has not started his 1.7mg . Has been tolerating medicine well. Duration: chronic Previous attempts at weight loss: yes, diet, exercise, joined weight watchers Complications of obesity: none Peak weight: 272 lbs (current) Weight loss goal: 200 Weight loss to date: 5 lbs Requesting obesity pharmacotherapy: yes Current weight loss supplements/medications: yes Previous weight loss supplements/meds: no   OCD Duration: chronic Status:better Anxious mood: no  Excessive worrying: no Irritability: no  Sweating: no Nausea: no Palpitations:no Hyperventilation: no Panic attacks: no Agoraphobia: no  Obscessions/compulsions: no Depressed mood: no    09/13/2023    4:13 PM 08/02/2023    4:21 PM 04/11/2023    2:46 PM 08/06/2022    2:49 PM 07/05/2022    8:45 AM  Depression screen PHQ 2/9  Decreased Interest 0 0 1 0 0  Down, Depressed, Hopeless 0 0 0 0 0  PHQ - 2 Score 0 0 1 0 0  Altered sleeping 0 0 1 1 0  Tired, decreased energy 1 0 1 1 1   Change in appetite 0 0 1 0 1  Feeling bad or failure about yourself  0 0 0 0 0  Trouble concentrating 0 0 1 0 3  Moving slowly or fidgety/restless 0 0 0 0 0  Suicidal thoughts 0 0 0 0 0  PHQ-9 Score 1 0 5 2 5   Difficult doing work/chores Not difficult at all Not difficult at all Somewhat difficult  Somewhat difficult   Anhedonia: no Weight changes: no Insomnia: no   Hypersomnia: no Fatigue/loss of energy: no Feelings of worthlessness: no Feelings of guilt: no Impaired concentration/indecisiveness: no Suicidal ideations:  no  Crying spells: no Recent Stressors/Life Changes: no   Relationship problems: no   Family stress: no     Financial stress: no    Job stress: no    Recent death/loss: no   Relevant past medical, surgical, family and social history reviewed and updated as indicated. Interim medical history since our last visit reviewed. Allergies and medications reviewed and updated.  Review of Systems  Constitutional: Negative.   Respiratory: Negative.    Gastrointestinal: Negative.   Musculoskeletal: Negative.   Psychiatric/Behavioral: Negative.      Per HPI unless specifically indicated above     Objective:    BP 124/63 (BP Location: Left Arm, Patient Position: Sitting, Cuff Size: Large)   Pulse 92   Temp 98.5 F (36.9 C) (Oral)   Resp 17   Ht 5' 10.98 (1.803 m)   Wt 267 lb 9.6 oz (121.4 kg)   SpO2 98%   BMI 37.34 kg/m   Wt Readings from Last 3 Encounters:  09/13/23 267 lb 9.6 oz (121.4 kg)  08/02/23 274 lb 6.4 oz (124.5 kg)  04/11/23 272 lb 12.8 oz (123.7 kg)    Physical Exam Vitals and nursing note reviewed.  Constitutional:      General: He is not in acute distress.    Appearance:  Normal appearance. He is obese. He is not ill-appearing, toxic-appearing or diaphoretic.  HENT:     Head: Normocephalic and atraumatic.     Right Ear: External ear normal.     Left Ear: External ear normal.     Nose: Nose normal.     Mouth/Throat:     Mouth: Mucous membranes are moist.     Pharynx: Oropharynx is clear.   Eyes:     General: No scleral icterus.       Right eye: No discharge.        Left eye: No discharge.     Extraocular Movements: Extraocular movements intact.     Conjunctiva/sclera: Conjunctivae normal.     Pupils: Pupils are equal, round, and reactive to light.    Cardiovascular:     Rate and Rhythm: Normal rate and regular rhythm.     Pulses: Normal pulses.     Heart sounds: Normal heart sounds. No murmur heard.    No friction rub. No gallop.  Pulmonary:      Effort: Pulmonary effort is normal. No respiratory distress.     Breath sounds: Normal breath sounds. No stridor. No wheezing, rhonchi or rales.  Chest:     Chest wall: No tenderness.   Musculoskeletal:        General: Normal range of motion.     Cervical back: Normal range of motion and neck supple.   Skin:    General: Skin is warm and dry.     Capillary Refill: Capillary refill takes less than 2 seconds.     Coloration: Skin is not jaundiced or pale.     Findings: No bruising, erythema, lesion or rash.   Neurological:     General: No focal deficit present.     Mental Status: He is alert and oriented to person, place, and time. Mental status is at baseline.   Psychiatric:        Mood and Affect: Mood normal.        Behavior: Behavior normal.        Thought Content: Thought content normal.        Judgment: Judgment normal.     Results for orders placed or performed in visit on 04/11/23  Bayer DCA Hb A1c Waived   Collection Time: 04/11/23  3:17 PM  Result Value Ref Range   HB A1C (BAYER DCA - WAIVED) 5.2 4.8 - 5.6 %      Assessment & Plan:   Problem List Items Addressed This Visit       Other   Mixed obsessional thoughts and acts - Primary   Under good control on current regimen. Continue current regimen. Continue to monitor. Call with any concerns. Refills given.        Class 2 obesity due to excess calories without serious comorbidity with body mass index (BMI) of 37.0 to 37.9 in adult   Congratulated patient on 5lb weight loss since his last visit. He is currently down 1.8% of his body weight. Will push his wegovy  dose to 2.4mg  and really work on diet and exercise. Recheck 6 weeks. Call with any concerns.       Relevant Medications   Semaglutide -Weight Management (WEGOVY ) 2.4 MG/0.75ML SOAJ     Follow up plan: Return in about 3 months (around 12/14/2023).

## 2023-09-13 NOTE — Assessment & Plan Note (Signed)
 Congratulated patient on 5lb weight loss since his last visit. He is currently down 1.8% of his body weight. Will push his wegovy  dose to 2.4mg  and really work on diet and exercise. Recheck 6 weeks. Call with any concerns.

## 2023-09-13 NOTE — Assessment & Plan Note (Signed)
 Under good control on current regimen. Continue current regimen. Continue to monitor. Call with any concerns. Refills given.

## 2023-09-25 ENCOUNTER — Other Ambulatory Visit: Payer: Self-pay | Admitting: Family Medicine

## 2023-09-27 NOTE — Telephone Encounter (Signed)
 Unable to refill per protocol, Rx discontinued 09/13/23.  Requested Prescriptions  Pending Prescriptions Disp Refills   WEGOVY  1 MG/0.5ML SOAJ [Pharmacy Med Name: Wegovy  1 MG/0.5ML Subcutaneous Solution Auto-injector] 4 mL 0    Sig: INJECT ONE SYRINGEFUL (1MG ) INTO THE SKIN ONCE WEEKLY     Endocrinology:  Diabetes - GLP-1 Receptor Agonists - semaglutide  Passed - 09/27/2023  3:27 PM      Passed - HBA1C in normal range and within 180 days    HB A1C (BAYER DCA - WAIVED)  Date Value Ref Range Status  04/11/2023 5.2 4.8 - 5.6 % Final    Comment:             Prediabetes: 5.7 - 6.4          Diabetes: >6.4          Glycemic control for adults with diabetes: <7.0          Passed - Cr in normal range and within 360 days    Creatinine, Ser  Date Value Ref Range Status  02/07/2023 1.14 0.76 - 1.27 mg/dL Final         Passed - Valid encounter within last 6 months    Recent Outpatient Visits           2 weeks ago Mixed obsessional thoughts and acts   [place] [facility] [place], Megan P, DO   1 month ago Class 2 obesity due to excess calories without serious comorbidity with body mass index (BMI) of 37.0 to 37.9 in adult   [facility] [place], Megan P, DO

## 2023-11-28 ENCOUNTER — Telehealth: Payer: Self-pay

## 2023-11-28 NOTE — Telephone Encounter (Signed)
 PA team- can you guys check into this for the patient please?

## 2023-11-28 NOTE — Telephone Encounter (Signed)
 The patient called back once again stating he is nervous he won't have his medicine by this week as he is due for his next injection on Sunday. He states I don't want to start over and really need this done as soon as possible. Please assist patient further with this prior authorization and let him know when it has been taken care of.

## 2023-11-28 NOTE — Telephone Encounter (Signed)
 Copied from CRM [id number]. Topic: Clinical - Medication Prior Auth >> Nov 28, 2023  1:20 PM Debby C wrote: Reason for CRM: Semaglutide -Weight Management (WEGOVY ) 2.4 MG/0.75ML SOAJ  Patient states that he needs a new Med Prior Authorization for the medication as his insurance states that the previous one was for 6 months only and it expired so they cannot pay for it until he gets a new one

## 2023-11-29 ENCOUNTER — Other Ambulatory Visit (HOSPITAL_COMMUNITY): Payer: Self-pay

## 2023-11-30 ENCOUNTER — Telehealth: Payer: Self-pay

## 2023-11-30 ENCOUNTER — Other Ambulatory Visit (HOSPITAL_COMMUNITY): Payer: Self-pay

## 2023-11-30 NOTE — Telephone Encounter (Signed)
 Pharmacy Patient Advocate Encounter   Received notification from Physician's Office that prior authorization for Wegovy  2.4MG /0.75ML auto-injectors is required/requested.   Insurance verification completed.   The patient is insured through [facility] .   Per test claim: PA required; PA submitted to above mentioned insurance via Latent Key/confirmation #/EOC [id number] Status is pending

## 2023-11-30 NOTE — Telephone Encounter (Signed)
 PA is currently pending, decisions by insurance can sometimes take up to a week, but hopefully we will hear back sooner. Thank you.

## 2023-12-02 NOTE — Telephone Encounter (Signed)
 If we had any options that were not an overbook for say then it wouldn't be a bad thing but if not he will just have to wait it out. Thank you!

## 2023-12-02 NOTE — Telephone Encounter (Signed)
 Pharmacy Patient Advocate Encounter  Received notification from [employer] that Prior Authorization for Wegovy  2.4MG /0.75ML auto-injectors has been DENIED.  Full denial letter will be uploaded to the media tab. See denial reason below.   PA #/Case ID/Reference #: [id number]

## 2023-12-05 NOTE — Telephone Encounter (Signed)
Scheduled for 9/18.

## 2023-12-08 ENCOUNTER — Encounter: Payer: Self-pay | Admitting: Family Medicine

## 2023-12-08 ENCOUNTER — Ambulatory Visit: Admitting: Family Medicine

## 2023-12-08 VITALS — BP 109/73 | HR 69 | Temp 97.3°F | Ht 70.0 in | Wt 258.0 lb

## 2023-12-08 DIAGNOSIS — E6609 Other obesity due to excess calories: Secondary | ICD-10-CM | POA: Diagnosis not present

## 2023-12-08 DIAGNOSIS — Z6837 Body mass index (BMI) 37.0-37.9, adult: Secondary | ICD-10-CM

## 2023-12-08 DIAGNOSIS — Z23 Encounter for immunization: Secondary | ICD-10-CM | POA: Diagnosis not present

## 2023-12-08 DIAGNOSIS — E66812 Obesity, class 2: Secondary | ICD-10-CM

## 2023-12-08 NOTE — Assessment & Plan Note (Addendum)
 Down 15lbs for 5.1% of his body weight 1 month on the wegovy  2.4- will recheck in 4 weeks. Continue diet and exercise. Continue current regimen.

## 2023-12-08 NOTE — Progress Notes (Signed)
 BP 109/73 (BP Location: Left Arm, Patient Position: Sitting, Cuff Size: Large)   Pulse 69   Temp (!) 97.3 F (36.3 C) (Oral)   Ht 5' 10 (1.778 m)   Wt 258 lb (117 kg)   SpO2 97%   BMI 37.02 kg/m    Subjective:    Patient ID: Bernard Price, male    DOB: 01-20-1996, 28 y.o.   MRN: [id number]  HPI: Bernard Price is a 28 y.o. male  Chief Complaint  Patient presents with   Weight Check    Pt presents today for a weight check. Insurance quit paying for wegovy , Pt needs a PA. He has been 1 week without Wegovy .   OBESITY- Has been on his wegovy  consistently until last week, has only been on his 2.4mg  for 1 month Duration: chronic Previous attempts at weight loss: yes, diet, exercise, joined weight watchers Complications of obesity: none Peak weight: 272 lbs (current) Weight loss goal: 200 Weight loss to date: 14 lbs Requesting obesity pharmacotherapy: yes Current weight loss supplements/medications: yes Previous weight loss supplements/meds: no  Relevant past medical, surgical, family and social history reviewed and updated as indicated. Interim medical history since our last visit reviewed. Allergies and medications reviewed and updated.  Review of Systems  Constitutional: Negative.   Respiratory: Negative.    Cardiovascular: Negative.   Musculoskeletal: Negative.   Neurological: Negative.   Psychiatric/Behavioral: Negative.      Per HPI unless specifically indicated above     Objective:    BP 109/73 (BP Location: Left Arm, Patient Position: Sitting, Cuff Size: Large)   Pulse 69   Temp (!) 97.3 F (36.3 C) (Oral)   Ht 5' 10 (1.778 m)   Wt 258 lb (117 kg)   SpO2 97%   BMI 37.02 kg/m   Wt Readings from Last 3 Encounters:  12/08/23 258 lb (117 kg)  09/13/23 267 lb 9.6 oz (121.4 kg)  08/02/23 274 lb 6.4 oz (124.5 kg)    Physical Exam Vitals and nursing note reviewed.  Constitutional:      General: He is not in acute distress.    Appearance: Normal  appearance. He is obese. He is not ill-appearing, toxic-appearing or diaphoretic.  HENT:     Head: Normocephalic and atraumatic.     Right Ear: External ear normal.     Left Ear: External ear normal.     Nose: Nose normal.     Mouth/Throat:     Mouth: Mucous membranes are moist.     Pharynx: Oropharynx is clear.  Eyes:     General: No scleral icterus.       Right eye: No discharge.        Left eye: No discharge.     Extraocular Movements: Extraocular movements intact.     Conjunctiva/sclera: Conjunctivae normal.     Pupils: Pupils are equal, round, and reactive to light.  Cardiovascular:     Rate and Rhythm: Normal rate and regular rhythm.     Pulses: Normal pulses.     Heart sounds: Normal heart sounds. No murmur heard.    No friction rub. No gallop.  Pulmonary:     Effort: Pulmonary effort is normal. No respiratory distress.     Breath sounds: Normal breath sounds. No stridor. No wheezing, rhonchi or rales.  Chest:     Chest wall: No tenderness.  Musculoskeletal:        General: Normal range of motion.     Cervical back: Normal  range of motion and neck supple.  Skin:    General: Skin is warm and dry.     Capillary Refill: Capillary refill takes less than 2 seconds.     Coloration: Skin is not jaundiced or pale.     Findings: No bruising, erythema, lesion or rash.  Neurological:     General: No focal deficit present.     Mental Status: He is alert and oriented to person, place, and time. Mental status is at baseline.  Psychiatric:        Mood and Affect: Mood normal.        Behavior: Behavior normal.        Thought Content: Thought content normal.        Judgment: Judgment normal.     Results for orders placed or performed in visit on 04/11/23  Bayer DCA Hb A1c Waived   Collection Time: 04/11/23  3:17 PM  Result Value Ref Range   HB A1C (BAYER DCA - WAIVED) 5.2 4.8 - 5.6 %      Assessment & Plan:   Problem List Items Addressed This Visit       Other   Class  2 obesity due to excess calories without serious comorbidity with body mass index (BMI) of 37.0 to 37.9 in adult - Primary   Down 15lbs for 5.1% of his body weight 1 month on the wegovy  2.4- will recheck in 4 weeks. Continue diet and exercise. Continue current regimen.       Other Visit Diagnoses       Needs flu shot       Relevant Orders   Flu vaccine trivalent PF, 6mos and older(Flulaval,Afluria,Fluarix,Fluzone) (Completed)        Follow up plan: Return in about 4 weeks (around 01/05/2024).

## 2023-12-12 ENCOUNTER — Telehealth: Payer: Self-pay

## 2023-12-12 ENCOUNTER — Other Ambulatory Visit (HOSPITAL_COMMUNITY): Payer: Self-pay

## 2023-12-12 NOTE — Telephone Encounter (Signed)
 Patient called in states PA for Wegovy  was approved was approved for the 2.5 and not 2.4, and he needs it approved for the 2.4

## 2023-12-12 NOTE — Progress Notes (Signed)
   12/12/2023  Patient ID: Bernard Price, male   DOB: [birth year], 28 y.o.   MRN: [id number]  Clinic routed request to assist with access/affordability of Wegovy  2.4mg  weekly.  Coverage was recently denied by insurance stating they would need chart notes reflecting patient has achieved at least a 5% reduction in weight loss since starting medication.  Re-sent prior authorization request via CoverMyMeds with recent visit notes from 9/18 reflecting current weight loss of 5.1%.  PA is currently pending, and I will follow-up on status daily until determination known.  (KEY [id number]).  Channing DELENA Mealing, PharmD, DPLA

## 2023-12-13 ENCOUNTER — Telehealth: Payer: Self-pay

## 2023-12-13 NOTE — Progress Notes (Signed)
   12/13/2023  Patient ID: Bernard Price, male   DOB: 07/16/1995, 28 y.o.   MRN: [id number]  Prior authorization for WEGOVY  2.4mg  has been approved.  Contacted [facility], and medication is now going through for $25 and will be ready later today. Sending patient a MyChart message to make him aware.  Channing DELENA Mealing, PharmD, DPLA

## 2023-12-15 ENCOUNTER — Ambulatory Visit: Admitting: Family Medicine

## 2023-12-30 ENCOUNTER — Encounter: Payer: Self-pay | Admitting: Family Medicine

## 2023-12-30 ENCOUNTER — Telehealth: Payer: Self-pay

## 2024-01-05 ENCOUNTER — Other Ambulatory Visit: Payer: Self-pay | Admitting: Family Medicine

## 2024-01-06 NOTE — Telephone Encounter (Signed)
 Has appointment Monday- does he have enough to get to that appointment?

## 2024-01-06 NOTE — Telephone Encounter (Signed)
 Requested medications are due for refill today.  yes  Requested medications are on the active medications list.  yes  Last refill. 09/13/2023 3mL 1 rf  Future visit scheduled.   yes  Notes to clinic.  Labs are expired.    Requested Prescriptions  Pending Prescriptions Disp Refills   WEGOVY  2.4 MG/0.75ML SOAJ SQ injection [Pharmacy Med Name: Wegovy  2.4 MG/0.75ML Subcutaneous Solution Auto-injector] 4 mL 0    Sig: INJECT ONE SYRINGEFUL INTO THE SKIN ONCE WEEKLY     Endocrinology:  Diabetes - GLP-1 Receptor Agonists - semaglutide  Failed - 01/06/2024  2:18 PM      Failed - HBA1C in normal range and within 180 days    HB A1C (BAYER DCA - WAIVED)  Date Value Ref Range Status  04/11/2023 5.2 4.8 - 5.6 % Final    Comment:             Prediabetes: 5.7 - 6.4          Diabetes: >6.4          Glycemic control for adults with diabetes: <7.0          Passed - Cr in normal range and within 360 days    Creatinine, Ser  Date Value Ref Range Status  02/07/2023 1.14 0.76 - 1.27 mg/dL Final         Passed - Valid encounter within last 6 months    Recent Outpatient Visits           4 weeks ago Class 2 obesity due to excess calories without serious comorbidity with body mass index (BMI) of 37.0 to 37.9 in adult   [place] [facility] [place], Megan P, DO   3 months ago Mixed obsessional thoughts and acts   [place] [facility] [place], Megan P, DO   5 months ago Class 2 obesity due to excess calories without serious comorbidity with body mass index (BMI) of 37.0 to 37.9 in adult   [facility] [place], Megan P, DO

## 2024-01-09 ENCOUNTER — Ambulatory Visit: Admitting: Family Medicine

## 2024-01-09 ENCOUNTER — Telehealth: Payer: Self-pay | Admitting: Pharmacy Technician

## 2024-01-09 ENCOUNTER — Other Ambulatory Visit (HOSPITAL_COMMUNITY): Payer: Self-pay

## 2024-01-09 VITALS — BP 109/73 | HR 79 | Temp 98.0°F | Ht 70.0 in | Wt 262.2 lb

## 2024-01-09 DIAGNOSIS — E66812 Obesity, class 2: Secondary | ICD-10-CM

## 2024-01-09 DIAGNOSIS — E6609 Other obesity due to excess calories: Secondary | ICD-10-CM | POA: Diagnosis not present

## 2024-01-09 DIAGNOSIS — Z6837 Body mass index (BMI) 37.0-37.9, adult: Secondary | ICD-10-CM

## 2024-01-09 MED ORDER — WEGOVY 2.4 MG/0.75ML ~~LOC~~ SOAJ: 2.4000 mg | SUBCUTANEOUS | 1 refills | Status: DC

## 2024-01-09 MED ORDER — TIRZEPATIDE-WEIGHT MANAGEMENT 10 MG/0.5ML ~~LOC~~ SOLN: 10.0000 mg | SUBCUTANEOUS | 2 refills | Status: DC

## 2024-01-09 NOTE — Assessment & Plan Note (Signed)
 Tolerating his medicine well. Weight loss has stalled. Will try to get him switched over to zepbound. Call with any concerns. Continue to monitor.

## 2024-01-09 NOTE — Progress Notes (Signed)
 BP 109/73   Pulse 79   Temp 98 F (36.7 C) (Oral)   Ht 5' 10 (1.778 m)   Wt 262 lb 3.2 oz (118.9 kg)   SpO2 94%   BMI 37.62 kg/m    Subjective:    Patient ID: Bernard Price, male    DOB: Apr 02, 1995, 28 y.o.   MRN: [id number]  HPI: Bernard Price is a 28 y.o. male  Chief Complaint  Patient presents with   Obesity   OBESITY-  Has only been on his 2.4mg  for 2 months Duration: chronic Previous attempts at weight loss: yes, diet, exercise, joined weight watchers Complications of obesity: none Peak weight: 272 lbs (current) Weight loss goal: 200 Weight loss to date: 10 lbs Requesting obesity pharmacotherapy: yes Current weight loss supplements/medications: yes Previous weight loss supplements/meds: no  Relevant past medical, surgical, family and social history reviewed and updated as indicated. Interim medical history since our last visit reviewed. Allergies and medications reviewed and updated.  Review of Systems  Constitutional: Negative.   Respiratory: Negative.    Cardiovascular: Negative.   Musculoskeletal: Negative.   Psychiatric/Behavioral: Negative.      Per HPI unless specifically indicated above     Objective:    BP 109/73   Pulse 79   Temp 98 F (36.7 C) (Oral)   Ht 5' 10 (1.778 m)   Wt 262 lb 3.2 oz (118.9 kg)   SpO2 94%   BMI 37.62 kg/m   Wt Readings from Last 3 Encounters:  01/09/24 262 lb 3.2 oz (118.9 kg)  12/08/23 258 lb (117 kg)  09/13/23 267 lb 9.6 oz (121.4 kg)    Physical Exam Vitals and nursing note reviewed.  Constitutional:      General: He is not in acute distress.    Appearance: Normal appearance. He is obese. He is not ill-appearing, toxic-appearing or diaphoretic.  HENT:     Head: Normocephalic and atraumatic.     Right Ear: External ear normal.     Left Ear: External ear normal.     Nose: Nose normal.     Mouth/Throat:     Mouth: Mucous membranes are moist.     Pharynx: Oropharynx is clear.  Eyes:     General:  No scleral icterus.       Right eye: No discharge.        Left eye: No discharge.     Extraocular Movements: Extraocular movements intact.     Conjunctiva/sclera: Conjunctivae normal.     Pupils: Pupils are equal, round, and reactive to light.  Cardiovascular:     Rate and Rhythm: Normal rate and regular rhythm.     Pulses: Normal pulses.     Heart sounds: Normal heart sounds. No murmur heard.    No friction rub. No gallop.  Pulmonary:     Effort: Pulmonary effort is normal. No respiratory distress.     Breath sounds: Normal breath sounds. No stridor. No wheezing, rhonchi or rales.  Chest:     Chest wall: No tenderness.  Musculoskeletal:        General: Normal range of motion.     Cervical back: Normal range of motion and neck supple.  Skin:    General: Skin is warm and dry.     Capillary Refill: Capillary refill takes less than 2 seconds.     Coloration: Skin is not jaundiced or pale.     Findings: No bruising, erythema, lesion or rash.  Neurological:  General: No focal deficit present.     Mental Status: He is alert and oriented to person, place, and time. Mental status is at baseline.  Psychiatric:        Mood and Affect: Mood normal.        Behavior: Behavior normal.        Thought Content: Thought content normal.        Judgment: Judgment normal.     Results for orders placed or performed in visit on 04/11/23  Bayer DCA Hb A1c Waived   Collection Time: 04/11/23  3:17 PM  Result Value Ref Range   HB A1C (BAYER DCA - WAIVED) 5.2 4.8 - 5.6 %      Assessment & Plan:   Problem List Items Addressed This Visit       Other   Class 2 obesity due to excess calories without serious comorbidity with body mass index (BMI) of 37.0 to 37.9 in adult - Primary   Tolerating his medicine well. Weight loss has stalled. Will try to get him switched over to zepbound. Call with any concerns. Continue to monitor.       Relevant Medications   semaglutide -weight management (WEGOVY )  2.4 MG/0.75ML SOAJ SQ injection   tirzepatide 10 MG/0.5ML injection vial     Follow up plan: Return in about 6 weeks (around 02/20/2024) for physical.

## 2024-01-09 NOTE — Telephone Encounter (Signed)
 Zepbound. He is not a diabetic.

## 2024-01-09 NOTE — Telephone Encounter (Signed)
 Pharmacy Patient Advocate Encounter   Received notification from CoverMyMeds that prior authorization for Mounjaro 10MG /0.5ML auto-injectors is required/requested.    Insurance verification completed.   The patient is insured through [facility].   Per test claim: Zepbound went through his plan as a paid claim, however his pharmacy sent Mounjaro to [facility]. It looks like the prescription was sent to his pharmacy as tirzepatide injection vial instead of pen which may be causing confusion at his pharmacy BUT zepbound is covered and copay is $24.99

## 2024-01-11 ENCOUNTER — Encounter: Payer: Self-pay | Admitting: Family Medicine

## 2024-01-12 NOTE — Addendum Note (Signed)
 Addended by: Karlton Maya M on: 01/12/2024 01:26 PM   Modules accepted: Orders

## 2024-02-22 ENCOUNTER — Encounter: Admitting: Family Medicine

## 2024-03-02 ENCOUNTER — Other Ambulatory Visit: Payer: Self-pay | Admitting: Family Medicine

## 2024-03-05 NOTE — Telephone Encounter (Signed)
 No longer on current medication list Requested Prescriptions  Pending Prescriptions Disp Refills   WEGOVY  2.4 MG/0.75ML SOAJ SQ injection [Pharmacy Med Name: Wegovy  2.4 MG/0.75ML Subcutaneous Solution Auto-injector] 4 mL 0    Sig: INJECT ONE SYRINGEFUL INTO THE SKIN ONCE WEEKLY     Endocrinology:  Diabetes - GLP-1 Receptor Agonists - semaglutide  Failed - 03/05/2024  2:36 PM      Failed - HBA1C in normal range and within 180 days    HB A1C (BAYER DCA - WAIVED)  Date Value Ref Range Status  04/11/2023 5.2 4.8 - 5.6 % Final    Comment:             Prediabetes: 5.7 - 6.4          Diabetes: >6.4          Glycemic control for adults with diabetes: <7.0          Failed - Cr in normal range and within 360 days    Creatinine, Ser  Date Value Ref Range Status  02/07/2023 1.14 0.76 - 1.27 mg/dL Final         Passed - Valid encounter within last 6 months    Recent Outpatient Visits           1 month ago Class 2 obesity due to excess calories without serious comorbidity with body mass index (BMI) of 37.0 to 37.9 in adult   [place] [facility] [place], Megan P, DO   2 months ago Class 2 obesity due to excess calories without serious comorbidity with body mass index (BMI) of 37.0 to 37.9 in adult   [facility], Megan P, DO   5 months ago Mixed obsessional thoughts and acts   [facility] [place], Megan P, DO   7 months ago Class 2 obesity due to excess calories without serious comorbidity with body mass index (BMI) of 37.0 to 37.9 in adult   [facility] [place], Megan P, DO

## 2024-03-11 ENCOUNTER — Encounter: Payer: Self-pay | Admitting: Family Medicine

## 2024-03-11 ENCOUNTER — Other Ambulatory Visit: Payer: Self-pay | Admitting: Family Medicine

## 2024-03-12 NOTE — Telephone Encounter (Signed)
 Called patient and left a message to call back to get scheduled.

## 2024-04-08 ENCOUNTER — Other Ambulatory Visit: Payer: Self-pay | Admitting: Family Medicine

## 2024-04-09 ENCOUNTER — Ambulatory Visit (INDEPENDENT_AMBULATORY_CARE_PROVIDER_SITE_OTHER): Admitting: Family Medicine

## 2024-04-09 ENCOUNTER — Encounter: Payer: Self-pay | Admitting: Family Medicine

## 2024-04-09 VITALS — BP 132/83 | HR 81 | Temp 98.0°F | Ht 70.0 in | Wt 255.2 lb

## 2024-04-09 DIAGNOSIS — E6609 Other obesity due to excess calories: Secondary | ICD-10-CM | POA: Diagnosis not present

## 2024-04-09 DIAGNOSIS — F422 Mixed obsessional thoughts and acts: Secondary | ICD-10-CM

## 2024-04-09 DIAGNOSIS — Z6836 Body mass index (BMI) 36.0-36.9, adult: Secondary | ICD-10-CM | POA: Diagnosis not present

## 2024-04-09 DIAGNOSIS — E66812 Obesity, class 2: Secondary | ICD-10-CM

## 2024-04-09 DIAGNOSIS — Z0000 Encounter for general adult medical examination without abnormal findings: Secondary | ICD-10-CM

## 2024-04-09 MED ORDER — CETIRIZINE HCL 10 MG PO TABS: 10.0000 mg | ORAL_TABLET | Freq: Every day | ORAL | 11 refills | Status: AC

## 2024-04-09 MED ORDER — SERTRALINE HCL 100 MG PO TABS: 150.0000 mg | ORAL_TABLET | Freq: Every day | ORAL | 1 refills | Status: AC

## 2024-04-09 MED ORDER — FLUTICASONE PROPIONATE 50 MCG/ACT NA SUSP: 2.0000 | Freq: Every day | NASAL | 12 refills | Status: AC

## 2024-04-09 MED ORDER — WEGOVY 2.4 MG/0.75ML ~~LOC~~ SOAJ: 2.4000 mg | SUBCUTANEOUS | 1 refills | Status: AC

## 2024-04-09 NOTE — Assessment & Plan Note (Signed)
 Down 17lbs for 6.25% of his weight. Continue current regimen. Continue to monitor. Call with any concerns. Refills given.

## 2024-04-09 NOTE — Assessment & Plan Note (Signed)
 Under good control on current regimen. Continue current regimen. Continue to monitor. Call with any concerns. Refills given.

## 2024-04-09 NOTE — Telephone Encounter (Signed)
 Requested Prescriptions  Pending Prescriptions Disp Refills   WEGOVY  2.4 MG/0.75ML SOAJ SQ injection [Pharmacy Med Name: Wegovy  2.4 MG/0.75ML Subcutaneous Solution Auto-injector] 4 mL 0    Sig: INJECT ONE SYRINGEFUL INTO THE SKIN ONCE WEEKLY     Endocrinology:  Diabetes - GLP-1 Receptor Agonists - semaglutide  Failed - 04/09/2024  3:29 PM      Failed - HBA1C in normal range and within 180 days    HB A1C (BAYER DCA - WAIVED)  Date Value Ref Range Status  04/11/2023 5.2 4.8 - 5.6 % Final    Comment:             Prediabetes: 5.7 - 6.4          Diabetes: >6.4          Glycemic control for adults with diabetes: <7.0          Failed - Cr in normal range and within 360 days    Creatinine, Ser  Date Value Ref Range Status  02/07/2023 1.14 0.76 - 1.27 mg/dL Final         Passed - Valid encounter within last 6 months    Recent Outpatient Visits           Today Routine general medical examination at a health care facility   [facility], Megan P, DO   3 months ago Class 2 obesity due to excess calories without serious comorbidity with body mass index (BMI) of 37.0 to 37.9 in adult   [facility], Megan P, DO   4 months ago Class 2 obesity due to excess calories without serious comorbidity with body mass index (BMI) of 37.0 to 37.9 in adult   [facility], Megan P, DO   6 months ago Mixed obsessional thoughts and acts   [place] [facility] [place], Megan P, DO   8 months ago Class 2 obesity due to excess calories without serious comorbidity with body mass index (BMI) of 37.0 to 37.9 in adult   [facility] [place], Megan P, DO

## 2024-04-09 NOTE — Progress Notes (Signed)
 "  BP 132/83   Pulse 81   Temp 98 F (36.7 C) (Oral)   Ht 5' 10 (1.778 m)   Wt 255 lb 3.2 oz (115.8 kg)   SpO2 96%   BMI 36.62 kg/m    Subjective:    Patient ID: Bernard Price, male    DOB: [birth year], 29 y.o.   MRN: [id number]  HPI: Bernard Price is a 29 y.o. male presenting on 04/09/2024 for comprehensive medical examination. Current medical complaints include:  OBESITY-  Duration: chronic Previous attempts at weight loss: yes, diet, exercise, joined weight watchers Complications of obesity: none Peak weight: 272 lbs  Weight loss goal: 200 Weight loss to date: 17 lbs Requesting obesity pharmacotherapy: yes Current weight loss supplements/medications: yes Previous weight loss supplements/meds: no  OCD Duration: chronic Status:controlled Anxious mood: no  Excessive worrying: no Irritability: no  Sweating: no Nausea: no Palpitations:no Hyperventilation: no Panic attacks: no Agoraphobia: no  Obscessions/compulsions: no Depressed mood: no    04/09/2024    3:39 PM 12/08/2023    9:17 AM 09/13/2023    4:13 PM 08/02/2023    4:21 PM 04/11/2023    2:46 PM  Depression screen PHQ 2/9  Decreased Interest 0 0 0 0 1  Down, Depressed, Hopeless 0 0 0 0 0  PHQ - 2 Score 0 0 0 0 1  Altered sleeping 0 1 0 0 1  Tired, decreased energy 0 1 1 0 1  Change in appetite 0 0 0 0 1  Feeling bad or failure about yourself  0 0 0 0 0  Trouble concentrating 0 0 0 0 1  Moving slowly or fidgety/restless 0 0 0 0 0  Suicidal thoughts 0 0 0 0 0  PHQ-9 Score 0 2  1  0  5   Difficult doing work/chores Not difficult at all Not difficult at all Not difficult at all Not difficult at all Somewhat difficult     Data saved with a previous flowsheet row definition   Anhedonia: no Weight changes: yes Insomnia: no  Hypersomnia: no Fatigue/loss of energy: no Feelings of worthlessness: no Feelings of guilt: no Impaired concentration/indecisiveness: no Suicidal ideations: no  Crying spells:  no Recent Stressors/Life Changes: no   Relationship problems: no   Family stress: no     Financial stress: no    Job stress: no    Recent death/loss: no  Interim Problems from his last visit: no  Depression Screen done today and results listed below:     04/09/2024    3:39 PM 12/08/2023    9:17 AM 09/13/2023    4:13 PM 08/02/2023    4:21 PM 04/11/2023    2:46 PM  Depression screen PHQ 2/9  Decreased Interest 0 0 0 0 1  Down, Depressed, Hopeless 0 0 0 0 0  PHQ - 2 Score 0 0 0 0 1  Altered sleeping 0 1 0 0 1  Tired, decreased energy 0 1 1 0 1  Change in appetite 0 0 0 0 1  Feeling bad or failure about yourself  0 0 0 0 0  Trouble concentrating 0 0 0 0 1  Moving slowly or fidgety/restless 0 0 0 0 0  Suicidal thoughts 0 0 0 0 0  PHQ-9 Score 0 2  1  0  5   Difficult doing work/chores Not difficult at all Not difficult at all Not difficult at all Not difficult at all Somewhat difficult     Data saved  with a previous flowsheet row definition     Past Medical History:  Past Medical History:  Diagnosis Date   Allergy    Asthma    as a child   History of ADHD     Surgical History:  History reviewed. No pertinent surgical history.  Medications:  No current outpatient medications on file prior to visit.   No current facility-administered medications on file prior to visit.    Allergies:  Allergies[1]  Social History:  Social History   Socioeconomic History   Marital status: Single    Spouse name: Not on file   Number of children: Not on file   Years of education: Not on file   Highest education level: GED or equivalent  Occupational History   Occupation: Copywriter, Advertising: [employer]  Tobacco Use   Smoking status: Never   Smokeless tobacco: Never  Vaping Use   Vaping status: Never Used  Substance and Sexual Activity   Alcohol use: No    Alcohol/week: 0.0 standard drinks of alcohol   Drug use: Never   Sexual activity: Yes  Other Topics Concern   Not on  file  Social History Narrative   Not on file   Social Drivers of Health   Tobacco Use: Low Risk (04/09/2024)   Patient History    Smoking Tobacco Use: Never    Smokeless Tobacco Use: Never    Passive Exposure: Not on file  Financial Resource Strain: Low Risk (04/09/2024)   Overall Financial Resource Strain (CARDIA)    Difficulty of Paying Living Expenses: Not hard at all  Food Insecurity: No Food Insecurity (04/09/2024)   Epic    Worried About Programme Researcher, Broadcasting/film/video in the Last Year: Never true    Ran Out of Food in the Last Year: Never true  Transportation Needs: No Transportation Needs (04/09/2024)   Epic    Lack of Transportation (Medical): No    Lack of Transportation (Non-Medical): No  Physical Activity: Sufficiently Active (04/09/2024)   Exercise Vital Sign    Days of Exercise per Week: 5 days    Minutes of Exercise per Session: 30 min  Stress: No Stress Concern Present (04/09/2024)   [employer] of Occupational Health - Occupational Stress Questionnaire    Feeling of Stress: Not at all  Social Connections: Socially Isolated (04/09/2024)   Social Connection and Isolation Panel    Frequency of Communication with Friends and Family: More than three times a week    Frequency of Social Gatherings with Friends and Family: More than three times a week    Attends Religious Services: Never    Database Administrator or Organizations: No    Attends Banker Meetings: Never    Marital Status: Never married  Intimate Partner Violence: Not At Risk (04/09/2024)   Epic    Fear of Current or Ex-Partner: No    Emotionally Abused: No    Physically Abused: No    Sexually Abused: No  Depression (PHQ2-9): Low Risk (04/09/2024)   Depression (PHQ2-9)    PHQ-2 Score: 0  Alcohol Screen: Low Risk (04/09/2024)   Alcohol Screen    Last Alcohol Screening Score (AUDIT): 0  Housing: Low Risk (04/09/2024)   Epic    Unable to Pay for Housing in the Last Year: No    Number of Times Moved  in the Last Year: 0    Homeless in the Last Year: No  Utilities: Not At Risk (04/09/2024)   Epic  Threatened with loss of utilities: No  Health Literacy: Adequate Health Literacy (04/09/2024)   B1300 Health Literacy    Frequency of need for help with medical instructions: Never   Tobacco Use History[2] Social History   Substance and Sexual Activity  Alcohol Use No   Alcohol/week: 0.0 standard drinks of alcohol    Family History:  Family History  Problem Relation Age of Onset   Hypertension Mother    Diabetes Mother    Diabetes Maternal Grandmother    Hypertension Maternal Grandmother    Hypertension Maternal Grandfather    Hypertension Paternal Grandmother    Diabetes Paternal Grandmother     Past medical history, surgical history, medications, allergies, family history and social history reviewed with patient today and changes made to appropriate areas of the chart.   Review of Systems  Constitutional: Negative.   HENT: Negative.    Eyes: Negative.   Respiratory: Negative.    Cardiovascular: Negative.   Gastrointestinal:  Positive for abdominal pain. Negative for blood in stool, constipation, diarrhea, heartburn, melena, nausea and vomiting.  Genitourinary: Negative.   Musculoskeletal: Negative.   Skin: Negative.   Neurological: Negative.   Endo/Heme/Allergies:  Positive for environmental allergies. Negative for polydipsia. Does not bruise/bleed easily.  Psychiatric/Behavioral: Negative.     All other ROS negative except what is listed above and in the HPI.      Objective:    BP 132/83   Pulse 81   Temp 98 F (36.7 C) (Oral)   Ht 5' 10 (1.778 m)   Wt 255 lb 3.2 oz (115.8 kg)   SpO2 96%   BMI 36.62 kg/m   Wt Readings from Last 3 Encounters:  04/09/24 255 lb 3.2 oz (115.8 kg)  01/09/24 262 lb 3.2 oz (118.9 kg)  12/08/23 258 lb (117 kg)    Physical Exam Vitals and nursing note reviewed.  Constitutional:      General: He is not in acute distress.     Appearance: Normal appearance. He is obese. He is not ill-appearing, toxic-appearing or diaphoretic.  HENT:     Head: Normocephalic and atraumatic.     Right Ear: Tympanic membrane, ear canal and external ear normal. There is no impacted cerumen.     Left Ear: Tympanic membrane, ear canal and external ear normal. There is no impacted cerumen.     Nose: Nose normal. No congestion or rhinorrhea.     Mouth/Throat:     Mouth: Mucous membranes are moist.     Pharynx: Oropharynx is clear. No oropharyngeal exudate or posterior oropharyngeal erythema.  Eyes:     General: No scleral icterus.       Right eye: No discharge.        Left eye: No discharge.     Extraocular Movements: Extraocular movements intact.     Conjunctiva/sclera: Conjunctivae normal.     Pupils: Pupils are equal, round, and reactive to light.  Neck:     Vascular: No carotid bruit.  Cardiovascular:     Rate and Rhythm: Normal rate and regular rhythm.     Pulses: Normal pulses.     Heart sounds: No murmur heard.    No friction rub. No gallop.  Pulmonary:     Effort: Pulmonary effort is normal. No respiratory distress.     Breath sounds: Normal breath sounds. No stridor. No wheezing, rhonchi or rales.  Chest:     Chest wall: No tenderness.  Abdominal:     General: Abdomen is flat. Bowel sounds  are normal. There is no distension.     Palpations: Abdomen is soft. There is no mass.     Tenderness: There is no abdominal tenderness. There is no right CVA tenderness, left CVA tenderness, guarding or rebound.     Hernia: No hernia is present.  Genitourinary:    Comments: Genital exam deferred with shared decision making Musculoskeletal:        General: No swelling, tenderness, deformity or signs of injury.     Cervical back: Normal range of motion and neck supple. No rigidity. No muscular tenderness.     Right lower leg: No edema.     Left lower leg: No edema.  Lymphadenopathy:     Cervical: No cervical adenopathy.  Skin:     General: Skin is warm and dry.     Capillary Refill: Capillary refill takes less than 2 seconds.     Coloration: Skin is not jaundiced or pale.     Findings: No bruising, erythema, lesion or rash.  Neurological:     General: No focal deficit present.     Mental Status: He is alert and oriented to person, place, and time.     Cranial Nerves: No cranial nerve deficit.     Sensory: No sensory deficit.     Motor: No weakness.     Coordination: Coordination normal.     Gait: Gait normal.     Deep Tendon Reflexes: Reflexes normal.  Psychiatric:        Mood and Affect: Mood normal.        Behavior: Behavior normal.        Thought Content: Thought content normal.        Judgment: Judgment normal.     Results for orders placed or performed in visit on 04/11/23  Bayer DCA Hb A1c Waived   Collection Time: 04/11/23  3:17 PM  Result Value Ref Range   HB A1C (BAYER DCA - WAIVED) 5.2 4.8 - 5.6 %      Assessment & Plan:   Problem List Items Addressed This Visit       Other   Mixed obsessional thoughts and acts   Under good control on current regimen. Continue current regimen. Continue to monitor. Call with any concerns. Refills given.      Class 2 obesity due to excess calories without serious comorbidity with body mass index (BMI) of 37.0 to 37.9 in adult   Down 17lbs for 6.25% of his weight. Continue current regimen. Continue to monitor. Call with any concerns. Refills given.       Relevant Medications   semaglutide -weight management (WEGOVY ) 2.4 MG/0.75ML SOAJ SQ injection   Other Visit Diagnoses       Routine general medical examination at a health care facility    -  Primary   Vaccines up to date. Screening labs checked today. Continue diet and exercise. Call with any concerns.   Relevant Orders   Comprehensive metabolic panel with GFR   CBC with Differential/Platelet   Lipid Panel w/o Chol/HDL Ratio   TSH        LABORATORY TESTING:  Health maintenance labs ordered  today as discussed above.   IMMUNIZATIONS:   - Tdap: Tetanus vaccination status reviewed: last tetanus booster within 10 years. - Influenza: Up to date - Prevnar: Up to date - COVID: Refused - HPV: Up to date  PATIENT COUNSELING:    Sexuality: Discussed sexually transmitted diseases, partner selection, use of condoms, avoidance of unintended pregnancy  and  contraceptive alternatives.   Advised to avoid cigarette smoking.  I discussed with the patient that most people either abstain from alcohol or drink within safe limits (<=14/week and <=4 drinks/occasion for males, <=7/weeks and <= 3 drinks/occasion for females) and that the risk for alcohol disorders and other health effects rises proportionally with the number of drinks per week and how often a drinker exceeds daily limits.  Discussed cessation/primary prevention of drug use and availability of treatment for abuse.   Diet: Encouraged to adjust caloric intake to maintain  or achieve ideal body weight, to reduce intake of dietary saturated fat and total fat, to limit sodium intake by avoiding high sodium foods and not adding table salt, and to maintain adequate dietary potassium and calcium preferably from fresh fruits, vegetables, and low-fat dairy products.    stressed the importance of regular exercise  Injury prevention: Discussed safety belts, safety helmets, smoke detector, smoking near bedding or upholstery.   Dental health: Discussed importance of regular tooth brushing, flossing, and dental visits.   Follow up plan: NEXT PREVENTATIVE PHYSICAL DUE IN 1 YEAR. Return in about 6 months (around 10/07/2024).     [1] No Known Allergies [2]  Social History Tobacco Use  Smoking Status Never  Smokeless Tobacco Never   "

## 2024-04-10 ENCOUNTER — Ambulatory Visit: Payer: Self-pay | Admitting: Family Medicine

## 2024-04-10 LAB — CBC WITH DIFFERENTIAL/PLATELET
Basophils Absolute: 0 x10E3/uL (ref 0.0–0.2)
Basos: 1 %
EOS (ABSOLUTE): 0.1 x10E3/uL (ref 0.0–0.4)
Eos: 2 %
Hematocrit: 48.3 % (ref 37.5–51.0)
Hemoglobin: 15.4 g/dL (ref 13.0–17.7)
Immature Grans (Abs): 0 x10E3/uL (ref 0.0–0.1)
Immature Granulocytes: 0 %
Lymphocytes Absolute: 2.1 x10E3/uL (ref 0.7–3.1)
Lymphs: 33 %
MCH: 28.8 pg (ref 26.6–33.0)
MCHC: 31.9 g/dL (ref 31.5–35.7)
MCV: 90 fL (ref 79–97)
Monocytes Absolute: 0.6 x10E3/uL (ref 0.1–0.9)
Monocytes: 9 %
Neutrophils Absolute: 3.5 x10E3/uL (ref 1.4–7.0)
Neutrophils: 55 %
Platelets: 345 x10E3/uL (ref 150–450)
RBC: 5.35 x10E6/uL (ref 4.14–5.80)
RDW: 13.6 % (ref 11.6–15.4)
WBC: 6.3 x10E3/uL (ref 3.4–10.8)

## 2024-04-10 LAB — COMPREHENSIVE METABOLIC PANEL WITH GFR
ALT: 57 IU/L — ABNORMAL HIGH (ref 0–44)
AST: 33 IU/L (ref 0–40)
Albumin: 4.5 g/dL (ref 4.3–5.2)
Alkaline Phosphatase: 126 IU/L — ABNORMAL HIGH (ref 47–123)
BUN/Creatinine Ratio: 9 (ref 9–20)
BUN: 8 mg/dL (ref 6–20)
Bilirubin Total: 0.3 mg/dL (ref 0.0–1.2)
CO2: 22 mmol/L (ref 20–29)
Calcium: 9.8 mg/dL (ref 8.7–10.2)
Chloride: 102 mmol/L (ref 96–106)
Creatinine, Ser: 0.92 mg/dL (ref 0.76–1.27)
Globulin, Total: 3 g/dL (ref 1.5–4.5)
Glucose: 80 mg/dL (ref 70–99)
Potassium: 4.3 mmol/L (ref 3.5–5.2)
Sodium: 140 mmol/L (ref 134–144)
Total Protein: 7.5 g/dL (ref 6.0–8.5)
eGFR: 118 mL/min/1.73

## 2024-04-10 LAB — LIPID PANEL W/O CHOL/HDL RATIO
Cholesterol, Total: 210 mg/dL — ABNORMAL HIGH (ref 100–199)
HDL: 47 mg/dL
LDL Chol Calc (NIH): 137 mg/dL — ABNORMAL HIGH (ref 0–99)
Triglycerides: 143 mg/dL (ref 0–149)
VLDL Cholesterol Cal: 26 mg/dL (ref 5–40)

## 2024-04-10 LAB — TSH: TSH: 2.25 u[IU]/mL (ref 0.450–4.500)

## 2024-04-18 ENCOUNTER — Telehealth: Payer: Self-pay | Admitting: Family Medicine

## 2024-04-18 ENCOUNTER — Ambulatory Visit
Admission: EM | Admit: 2024-04-18 | Discharge: 2024-04-18 | Disposition: A | Discharge status: Home / Self Care | Attending: Emergency Medicine | Admitting: Emergency Medicine

## 2024-04-18 DIAGNOSIS — J069 Acute upper respiratory infection, unspecified: Secondary | ICD-10-CM | POA: Diagnosis not present

## 2024-04-18 LAB — POC COVID19/FLU A&B COMBO
Covid Antigen, POC: NEGATIVE
Influenza A Antigen, POC: NEGATIVE
Influenza B Antigen, POC: NEGATIVE

## 2024-04-18 LAB — POCT RAPID STREP A (OFFICE): Rapid Strep A Screen: NEGATIVE

## 2024-04-18 NOTE — ED Triage Notes (Signed)
 Sore throat, congestion, ear fullness, cough x 2 days. Taking mucinex and otc allergy medication.

## 2024-04-18 NOTE — Telephone Encounter (Unsigned)
 Copied from CRM #[id number]. Topic: Clinical - Refused Triage >> Apr 18, 2024  2:04 PM Eva FALCON wrote: Patient/caller voiced complaints of sore throat, difficulty swallowing, tonsils are swollen, ears are stopped up, congested, blowing out thick white mucus.. Declined transfer to triage. Will go to urgent care instead.   If patient is unestablished, route message to [facility] Nurse Triage If patient is established, route message to the appropriate department clinical pool

## 2024-04-18 NOTE — Discharge Instructions (Addendum)
 The strep, COVID and flu tests are negative.   Take Tylenol or ibuprofen as needed for fever or discomfort.  Take plain Mucinex as needed for congestion.  Rest and keep yourself hydrated.    Follow-up with your primary care provider if your symptoms are not improving.

## 2024-04-18 NOTE — ED Provider Notes (Signed)
 " CAY RALPH PELT    CSN: [id number] Arrival date & time: 04/18/24  1434      History   Chief Complaint Chief Complaint  Patient presents with   Sore Throat   Nasal Congestion    HPI Bernard Price is a 29 y.o. male.  Patient presents with 2-day history of ear fullness, congestion, postnasal drainage, sore throat, cough.  No fever, shortness of breath, vomiting, diarrhea.  He has been treating his symptoms with Mucinex.  He takes allergy medication and nasal spray daily.  He reports history of asthma as a child but has not required treatment as an adult.  The history is provided by the patient and medical records.    Past Medical History:  Diagnosis Date   Allergy    Asthma    as a child   History of ADHD     Patient Active Problem List   Diagnosis Date Noted   Class 2 obesity due to excess calories without serious comorbidity with body mass index (BMI) of 37.0 to 37.9 in adult 04/11/2023   Mixed obsessional thoughts and acts 09/17/2021    History reviewed. No pertinent surgical history.     Home Medications    Prior to Admission medications  Medication Sig Start Date End Date Taking? Authorizing Provider  cetirizine  (ZYRTEC ) 10 MG tablet Take 1 tablet (10 mg total) by mouth daily. 04/09/24  Yes Johnson, Megan P, DO  fluticasone  (FLONASE ) 50 MCG/ACT nasal spray Place 2 sprays into both nostrils daily. 04/09/24  Yes Johnson, Megan P, DO  semaglutide -weight management (WEGOVY ) 2.4 MG/0.75ML SOAJ SQ injection Inject 2.4 mg into the skin once a week. 04/09/24  Yes Johnson, Megan P, DO  sertraline  (ZOLOFT ) 100 MG tablet Take 1.5 tablets (150 mg total) by mouth daily. 04/09/24  Yes Vicci Duwaine SQUIBB, DO    Family History Family History  Problem Relation Age of Onset   Hypertension Mother    Diabetes Mother    Diabetes Maternal Grandmother    Hypertension Maternal Grandmother    Hypertension Maternal Grandfather    Hypertension Paternal Grandmother    Diabetes  Paternal Grandmother     Social History Social History[1]   Allergies   Patient has no known allergies.   Review of Systems Review of Systems  Constitutional:  Negative for chills and fever.  HENT:  Positive for congestion, ear pain, postnasal drip and sore throat.   Respiratory:  Positive for cough. Negative for shortness of breath.   Gastrointestinal:  Negative for diarrhea and vomiting.     Physical Exam Triage Vital Signs ED Triage Vitals [04/18/24 1521]  Encounter Vitals Group     BP 121/81     Girls Systolic BP Percentile      Girls Diastolic BP Percentile      Boys Systolic BP Percentile      Boys Diastolic BP Percentile      Pulse Rate 86     Resp 18     Temp 98.1 F (36.7 C)     Temp src      SpO2 96 %     Weight      Height      Head Circumference      Peak Flow      Pain Score      Pain Loc      Pain Education      Exclude from Growth Chart    No data found.  Updated Vital Signs BP 121/81  Pulse 86   Temp 98.1 F (36.7 C)   Resp 18   SpO2 96%   Visual Acuity Right Eye Distance:   Left Eye Distance:   Bilateral Distance:    Right Eye Near:   Left Eye Near:    Bilateral Near:     Physical Exam Constitutional:      General: He is not in acute distress. HENT:     Right Ear: Tympanic membrane normal.     Left Ear: Tympanic membrane normal.     Nose: Congestion present.     Mouth/Throat:     Mouth: Mucous membranes are moist.     Pharynx: Oropharynx is clear.  Cardiovascular:     Rate and Rhythm: Normal rate and regular rhythm.     Heart sounds: Normal heart sounds.  Pulmonary:     Effort: Pulmonary effort is normal. No respiratory distress.     Breath sounds: Normal breath sounds.  Neurological:     Mental Status: He is alert.      UC Treatments / Results  Labs (all labs ordered are listed, but only abnormal results are displayed) Labs Reviewed  POC COVID19/FLU A&B COMBO - Normal  POCT RAPID STREP A (OFFICE) - Normal     EKG   Radiology No results found.  Procedures Procedures (including critical care time)  Medications Ordered in UC Medications - No data to display  Initial Impression / Assessment and Plan / UC Course  I have reviewed the triage vital signs and the nursing notes.  Pertinent labs & imaging results that were available during my care of the patient were reviewed by me and considered in my medical decision making (see chart for details).    Viral URI.  Afebrile and vital signs are stable.  Rapid strep, COVID and flu negative.  Discussed symptomatic treatment including Tylenol or ibuprofen  as needed for fever or discomfort, plain Mucinex as needed for congestion, rest, hydration.  Instructed patient to follow-up with his PCP if he is not improving.  ED precautions given.  Patient agrees to plan of care.  Final Clinical Impressions(s) / UC Diagnoses   Final diagnoses:  Viral URI     Discharge Instructions      The strep, COVID and flu tests are negative.   Take Tylenol or ibuprofen  as needed for fever or discomfort.  Take plain Mucinex as needed for congestion.  Rest and keep yourself hydrated.    Follow-up with your primary care provider if your symptoms are not improving.         ED Prescriptions   None    PDMP not reviewed this encounter.    [1]  Social History Tobacco Use   Smoking status: Never   Smokeless tobacco: Never  Vaping Use   Vaping status: Never Used  Substance Use Topics   Alcohol use: No    Alcohol/week: 0.0 standard drinks of alcohol   Drug use: Never     Corlis Burnard DEL, NP 04/18/24 1608  "

## 2024-10-08 ENCOUNTER — Ambulatory Visit: Admitting: Family Medicine
